# Patient Record
Sex: Female | Born: 1995 | Race: Black or African American | Hispanic: No | Marital: Single | Smoking: Never smoker
Health system: Southern US, Community
[De-identification: ages and names within clinical notes are randomized; demographics above are authoritative.]

## PROBLEM LIST (undated history)

## (undated) DIAGNOSIS — O139 Gestational [pregnancy-induced] hypertension without significant proteinuria, unspecified trimester: Secondary | ICD-10-CM

## (undated) HISTORY — DX: Gestational (pregnancy-induced) hypertension without significant proteinuria, unspecified trimester: O13.9

---

## 2017-03-11 ENCOUNTER — Other Ambulatory Visit: Payer: Self-pay | Admitting: Nurse Practitioner

## 2017-03-11 ENCOUNTER — Other Ambulatory Visit (HOSPITAL_COMMUNITY)
Admission: RE | Admit: 2017-03-11 | Discharge: 2017-03-11 | Disposition: A | Discharge status: Home / Self Care | Payer: BC Managed Care – PPO | Source: Ambulatory Visit | Attending: Obstetrics and Gynecology | Admitting: Obstetrics and Gynecology

## 2017-03-11 DIAGNOSIS — Z01419 Encounter for gynecological examination (general) (routine) without abnormal findings: Secondary | ICD-10-CM | POA: Insufficient documentation

## 2017-03-15 LAB — CYTOLOGY - PAP: Diagnosis: NEGATIVE

## 2020-02-28 ENCOUNTER — Ambulatory Visit: Payer: BC Managed Care – PPO | Admitting: Podiatry

## 2020-10-17 ENCOUNTER — Other Ambulatory Visit: Payer: Self-pay

## 2020-10-17 ENCOUNTER — Encounter (HOSPITAL_COMMUNITY): Payer: Self-pay | Admitting: Emergency Medicine

## 2020-10-17 ENCOUNTER — Emergency Department (HOSPITAL_COMMUNITY)
Admission: EM | Admit: 2020-10-17 | Discharge: 2020-10-17 | Disposition: A | Discharge status: Home / Self Care | Payer: BC Managed Care – PPO | Attending: Emergency Medicine | Admitting: Emergency Medicine

## 2020-10-17 DIAGNOSIS — R1030 Lower abdominal pain, unspecified: Secondary | ICD-10-CM

## 2020-10-17 LAB — CBC
HCT: 38.9 % (ref 36.0–46.0)
Hemoglobin: 12.6 g/dL (ref 12.0–15.0)
MCH: 30.4 pg (ref 26.0–34.0)
MCHC: 32.4 g/dL (ref 30.0–36.0)
MCV: 93.7 fL (ref 80.0–100.0)
Platelets: 257 10*3/uL (ref 150–400)
RBC: 4.15 MIL/uL (ref 3.87–5.11)
RDW: 13 % (ref 11.5–15.5)
WBC: 5.7 10*3/uL (ref 4.0–10.5)
nRBC: 0 % (ref 0.0–0.2)

## 2020-10-17 LAB — LIPASE, BLOOD: Lipase: 34 U/L (ref 11–51)

## 2020-10-17 LAB — COMPREHENSIVE METABOLIC PANEL
ALT: 20 U/L (ref 0–44)
AST: 19 U/L (ref 15–41)
Albumin: 4.1 g/dL (ref 3.5–5.0)
Alkaline Phosphatase: 44 U/L (ref 38–126)
Anion gap: 9 (ref 5–15)
BUN: 14 mg/dL (ref 6–20)
CO2: 24 mmol/L (ref 22–32)
Calcium: 9.1 mg/dL (ref 8.9–10.3)
Chloride: 107 mmol/L (ref 98–111)
Creatinine, Ser: 0.75 mg/dL (ref 0.44–1.00)
GFR, Estimated: >60 mL/min (ref >60)
Glucose, Bld: 96 mg/dL (ref 70–99)
Potassium: 3.9 mmol/L (ref 3.5–5.1)
Sodium: 140 mmol/L (ref 135–145)
Total Bilirubin: 0.2 mg/dL — ABNORMAL LOW (ref 0.3–1.2)
Total Protein: 7.2 g/dL (ref 6.5–8.1)

## 2020-10-17 LAB — URINALYSIS, ROUTINE W REFLEX MICROSCOPIC
Bilirubin Urine: NEGATIVE
Glucose, UA: NEGATIVE mg/dL
Hgb urine dipstick: NEGATIVE
Ketones, ur: NEGATIVE mg/dL
Leukocytes,Ua: NEGATIVE
Nitrite: NEGATIVE
Protein, ur: NEGATIVE mg/dL
Specific Gravity, Urine: 1.026 (ref 1.005–1.030)
pH: 6 (ref 5.0–8.0)

## 2020-10-17 LAB — I-STAT BETA HCG BLOOD, ED (MC, WL, AP ONLY): I-stat hCG, quantitative: <5 m[IU]/mL (ref <5)

## 2020-10-17 MED ORDER — SUCRALFATE 1 G PO TABS: 1.0000 g | ORAL_TABLET | Freq: Three times a day (TID) | ORAL | 0 refills | Status: DC

## 2020-10-17 MED ORDER — OMEPRAZOLE 20 MG PO CPDR: 20.0000 mg | DELAYED_RELEASE_CAPSULE | Freq: Every day | ORAL | 0 refills | Status: DC

## 2020-10-17 NOTE — ED Provider Notes (Signed)
[facility]-EMERGENCY DEPT Provider Note   CSN: [id number] Arrival date & time: 10/17/20  2032     History Chief Complaint  Patient presents with  . Abdominal Pain    JAKYRIA Brooks is a 25 y.o. female.  Patient presents to the ED with a chief complaint of abdominal pain.  She states that the pain is in her lower abdomen.  She states that her symptoms have been gradually worsening for the past few months.  She states that she feels bloated after eating and states that her pain is worsened with eating.  She denies pelvic pain, vaginal/urinary symptoms.  She denies any treatments PTA.  She denies any fever or vomiting.  The history is provided by the patient. No language interpreter was used.       History reviewed. No pertinent past medical history.  There are no problems to display for this patient.   History reviewed. No pertinent surgical history.   OB History   No obstetric history on file.     History reviewed. No pertinent family history.  Social History   Tobacco Use  . Smoking status: Never Smoker  . Smokeless tobacco: Never Used  Vaping Use  . Vaping Use: Never used  Substance Use Topics  . Alcohol use: Yes  . Drug use: Never    Home Medications Prior to Admission medications   Not on File    Allergies    Patient has no known allergies.  Review of Systems   Review of Systems  All other systems reviewed and are negative.   Physical Exam Updated Vital Signs BP (!) 147/76 (BP Location: Left Arm)   Pulse 85   Temp 98.7 F (37.1 C) (Oral)   Resp 18   Ht 5\' 9"  (1.753 m)   Wt 83.9 kg   LMP 10/08/2020   SpO2 95%   BMI 27.32 kg/m   Physical Exam Vitals and nursing note reviewed.  Constitutional:      General: She is not in acute distress.    Appearance: She is well-developed and well-nourished.  HENT:     Head: Normocephalic and atraumatic.  Eyes:     Conjunctiva/sclera: Conjunctivae normal.  Cardiovascular:      Rate and Rhythm: Normal rate and regular rhythm.     Heart sounds: No murmur heard.   Pulmonary:     Effort: Pulmonary effort is normal. No respiratory distress.     Breath sounds: Normal breath sounds.  Abdominal:     Palpations: Abdomen is soft.     Tenderness: There is no abdominal tenderness.     Comments: No focal abdominal tenderness, no RLQ tenderness or pain at McBurney's point, no RUQ tenderness or Murphy's sign, no left-sided abdominal tenderness, no fluid wave, or signs of peritonitis   Musculoskeletal:        General: No edema. Normal range of motion.     Cervical back: Neck supple.  Skin:    General: Skin is warm and dry.  Neurological:     Mental Status: She is alert and oriented to person, place, and time.  Psychiatric:        Mood and Affect: Mood and affect and mood normal.        Behavior: Behavior normal.     ED Results / Procedures / Treatments   Labs (all labs ordered are listed, but only abnormal results are displayed) Labs Reviewed  COMPREHENSIVE METABOLIC PANEL - Abnormal; Notable for the following components:  Result Value   Total Bilirubin 0.2 (*)    All other components within normal limits  LIPASE, BLOOD  CBC  URINALYSIS, ROUTINE W REFLEX MICROSCOPIC  I-STAT BETA HCG BLOOD, ED ([facility], [facility], [facility] ONLY)    EKG None  Radiology No results found.  Procedures Procedures   Medications Ordered in ED Medications - No data to display  ED Course  I have reviewed the triage vital signs and the nursing notes.  Pertinent labs & imaging results that were available during my care of the patient were reviewed by me and considered in my medical decision making (see chart for details).    MDM Rules/Calculators/A&P                          Patient presents with gradually worsening lower abdominal pain x3 months.  She has no focal tenderness on my exam.  Vital signs are stable.  Afebrile.  She denies dysuria, denies vaginal discharge or bleeding.  She  is scheduled to see GI at the end of this month.  Her symptoms are worsened with eating, and she states that she feels bloated.  Laboratory work-up is reassuring tonight.  No leukocytosis, no electrolyte derangement, normal LFTs, normal lipase, pregnancy test negative.  We discussed advanced imaging, but at this time, feel that it is likely not indicated and that she would be better off seeing GI.  I did offer to obtain CT, but she declined and will follow up.  Return precautions discussed.   Final Clinical Impression(s) / ED Diagnoses Final diagnoses:  Lower abdominal pain    Rx / DC Orders ED Discharge Orders    None       Roxy Horseman, PA-C 10/17/20 2315    Molpus, Jonny Ruiz, MD 10/18/20 [phone]

## 2020-10-17 NOTE — ED Triage Notes (Signed)
Patient is complaining of left lower abdominal pain that started in December. Patient states that it has gotten more painful in the last couple of weeks

## 2021-05-08 ENCOUNTER — Telehealth: Payer: Self-pay | Admitting: *Deleted

## 2021-05-08 NOTE — Telephone Encounter (Signed)
Not a New GYN but a New OB. LVM for patient to reschedule as a New OB with either [facility] or [facility] and that this appointment will be canceled. Has an appointment at both locations.

## 2021-05-21 ENCOUNTER — Encounter: Payer: BC Managed Care – PPO | Admitting: Family Medicine

## 2021-06-06 ENCOUNTER — Other Ambulatory Visit: Payer: Self-pay

## 2021-06-06 ENCOUNTER — Ambulatory Visit (INDEPENDENT_AMBULATORY_CARE_PROVIDER_SITE_OTHER): Payer: BC Managed Care – PPO

## 2021-06-06 DIAGNOSIS — Z3201 Encounter for pregnancy test, result positive: Secondary | ICD-10-CM

## 2021-06-06 LAB — POCT PREGNANCY, URINE: Preg Test, Ur: POSITIVE — AB

## 2021-06-06 NOTE — Progress Notes (Signed)
Pt dropped off urine today for UPT. UPT is  positive.  Pt left office before being seen by provider. Called pt once by front office with no answer. No message left.   Judeth Cornfield, RN

## 2021-06-09 ENCOUNTER — Encounter: Payer: BC Managed Care – PPO | Admitting: Obstetrics & Gynecology

## 2021-06-11 ENCOUNTER — Encounter: Payer: BC Managed Care – PPO | Admitting: Family Medicine

## 2021-06-11 ENCOUNTER — Telehealth (INDEPENDENT_AMBULATORY_CARE_PROVIDER_SITE_OTHER): Payer: BC Managed Care – PPO

## 2021-06-11 DIAGNOSIS — Z3A14 14 weeks gestation of pregnancy: Secondary | ICD-10-CM

## 2021-06-11 DIAGNOSIS — Z3480 Encounter for supervision of other normal pregnancy, unspecified trimester: Secondary | ICD-10-CM | POA: Insufficient documentation

## 2021-06-11 DIAGNOSIS — Z3492 Encounter for supervision of normal pregnancy, unspecified, second trimester: Secondary | ICD-10-CM

## 2021-06-11 NOTE — Patient Instructions (Signed)
  At our [facility], we work as an integrated team, providing care to address both physical and emotional health. Your medical provider may refer you to see our Behavioral Health Clinician (BHC) on the same day you see your medical provider, as availability permits; often scheduled virtually at your convenience.  Our BHC is available to all patients, visits generally last between 20-30 minutes, but can be longer or shorter, depending on patient need. The BHC offers help with stress management, coping with symptoms of depression and anxiety, major life changes , sleep issues, changing risky behavior, grief and loss, life stress, working on personal life goals, and  behavioral health issues, as these all affect your overall health and wellness.  The BHC is NOT available for the following: FMLA paperwork, court-ordered evaluations, specialty assessments (custody or disability), letters to employers, or obtaining certification for an emotional support animal. The BHC does not provide long-term therapy. You have the right to refuse integrated behavioral health services, or to reschedule to see the BHC at a later date.  Confidentiality exception: If it is suspected that a child or disabled adult is being abused or neglected, we are required by law to report that to either Child Protective Services or Adult Protective Services.  If you have a diagnosis of Bipolar affective disorder, Schizophrenia, or recurrent Major depressive disorder, we will recommend that you establish care with a psychiatrist, as these are lifelong, chronic conditions, and we want your overall emotional health and medications to be more closely monitored. If you anticipate needing extended maternity leave due to mental health issues postpartum, it it recommended you inform your medical provider, so we can put in a referral to a psychiatrist as soon as possible. The BHC is unable to recommend an extended maternity leave for mental  health issues. Your medical provider or BHC may refer you to a therapist for ongoing, traditional therapy, or to a psychiatrist, for medication management, if it would benefit your overall health. Depending on your insurance, you may have a copay or be charged a deductible, depending on your insurance, to see the BHC. If you are uninsured, it is recommended that you apply for financial assistance. (Forms may be requested at the front desk for in-person visits, via MyChart, or request a form during a virtual visit).  If you see the BHC more than 6 times, you will have to complete a comprehensive clinical assessment interview with the BHC to resume integrated services.  For virtual visits with the BHC, you must be physically in the state of [place] at the time of the visit. For example, if you live in [place], you will have to do an in-person visit with the BHC, and your out-of-state insurance may not cover behavioral health services in [place]. If you are going out of the state or country for any reason, the BHC may see you virtually when you return to [place], but not while you are physically outside of [place].    

## 2021-06-11 NOTE — Progress Notes (Addendum)
New OB Intake  I connected with  Zella Richer on 06/11/21 at  3:15 PM EDT by MyChart Video Visit and verified that I am speaking with the correct person using two identifiers. Nurse is located at [facility] and pt is located at home.  I discussed the limitations, risks, security and privacy concerns of performing an evaluation and management service by telephone and the availability of in person appointments. I also discussed with the patient that there may be a patient responsible charge related to this service. The patient expressed understanding and agreed to proceed.  I explained I am completing New OB Intake today. We discussed her EDD of 12/09/21 that is based on LMP of 03/04/21. Pt is G2/P1. I reviewed her allergies, medications, Medical/Surgical/OB history, and appropriate screenings. I informed her of [facility] services. [facility] information placed in AVS. Based on history, this is a low risk pregnancy.   Patient Active Problem List   Diagnosis Date Noted   Supervision of low-risk pregnancy, second trimester 06/11/2021    Concerns addressed today Prenatal care with first pregnancy was completed by Jennet Maduro, MD in [place], [place]; delivered at [facility] [place]. Patient reports elevated blood pressure and temperature following delivery. States she was not diagnosed with pre-eclampsia or gestational hypertension. Was not discharged on any blood pressure medication.  Vaginal discharge: patient reports increased vaginal discharge with no odor or discomfort. Reports this looks similar to mucous plug with her first pregnancy. I reassured pt that increased normal vaginal discharge is expected during pregnancy and to follow up if this begins to have an odor or if she experiences discomfort. Also, offered vaginal swab at new ob appt.   Delivery Plans:  Plans to deliver at [facility].   MyChart/Babyscripts MyChart access verified. I explained pt will have some visits in office and some  virtually. Babyscripts instructions given and order placed.   Blood Pressure Cuff  Patient has BP cuff at home. Explained after first prenatal appt pt will check weekly and document in Babyscripts.  Weight scale: Patient has access to weight scale, not at home. Weight scale ordered for patient to pick up form [facility].   Anatomy US Explained first scheduled Korea will be around 19 weeks. Anatomy US scheduled for 07/17/21 at 1430.  Labs Discussed Avelina Laine genetic screening with patient. Would like both Panorama and Horizon drawn at new OB visit. Routine prenatal labs needed.  COVID Vaccine Patient has had Pfizer COVID vaccine. Believes this was January and February 2022.  Mother/ Baby Dyad Candidate?    Not a candidate.  Social Determinants of Health Food Insecurity: Patient denies food insecurity. WIC Referral: Patient is not interested in referral to [facility].  Transportation: Patient denies transportation needs. Childcare: Discussed no children allowed at ultrasound appointments. Offered childcare services; patient declines childcare services at this time.  First visit review I reviewed new OB appt with pt. I explained she will have a provider visit that will include a physical exam and ob bloodwork with genetic screening. PAP smear completed in March of 2023, will have ROI signed at first appt. Explained pt will be seen by Luna Kitchens, CNM at first visit; encounter routed to appropriate provider. Explained that patient will be seen by pregnancy navigator following visit with provider.   Marjo Bicker, RN 06/11/2021  3:32 PM

## 2021-06-19 NOTE — Progress Notes (Signed)
Chart reviewed for nurse visit. Agree with plan of care.   Lavan Imes Lorraine, CNM 06/19/2021 5:23 PM   

## 2021-06-27 ENCOUNTER — Encounter: Payer: BC Managed Care – PPO | Admitting: Family

## 2021-06-30 ENCOUNTER — Other Ambulatory Visit (HOSPITAL_COMMUNITY)
Admission: RE | Admit: 2021-06-30 | Discharge: 2021-06-30 | Disposition: A | Discharge status: Home / Self Care | Payer: Medicaid Other | Source: Ambulatory Visit | Attending: Family | Admitting: Family

## 2021-06-30 ENCOUNTER — Other Ambulatory Visit: Payer: Self-pay

## 2021-06-30 ENCOUNTER — Ambulatory Visit (INDEPENDENT_AMBULATORY_CARE_PROVIDER_SITE_OTHER): Payer: BC Managed Care – PPO | Admitting: Student

## 2021-06-30 VITALS — BP 123/70 | HR 95 | Wt 188.9 lb

## 2021-06-30 DIAGNOSIS — Z23 Encounter for immunization: Secondary | ICD-10-CM | POA: Diagnosis not present

## 2021-06-30 DIAGNOSIS — Z3483 Encounter for supervision of other normal pregnancy, third trimester: Secondary | ICD-10-CM | POA: Diagnosis not present

## 2021-06-30 DIAGNOSIS — Z3A16 16 weeks gestation of pregnancy: Secondary | ICD-10-CM

## 2021-06-30 DIAGNOSIS — Z3492 Encounter for supervision of normal pregnancy, unspecified, second trimester: Secondary | ICD-10-CM | POA: Insufficient documentation

## 2021-06-30 DIAGNOSIS — Z3A40 40 weeks gestation of pregnancy: Secondary | ICD-10-CM | POA: Diagnosis not present

## 2021-06-30 DIAGNOSIS — O0931 Supervision of pregnancy with insufficient antenatal care, first trimester: Secondary | ICD-10-CM

## 2021-06-30 LAB — POCT URINALYSIS DIP (DEVICE)
Bilirubin Urine: NEGATIVE
Glucose, UA: NEGATIVE mg/dL
Hgb urine dipstick: NEGATIVE
Ketones, ur: NEGATIVE mg/dL
Leukocytes,Ua: NEGATIVE
Nitrite: NEGATIVE
Protein, ur: NEGATIVE mg/dL
Specific Gravity, Urine: >=1.03 (ref 1.005–1.030)
Urobilinogen, UA: 0.2 mg/dL (ref 0.0–1.0)
pH: 5.5 (ref 5.0–8.0)

## 2021-06-30 NOTE — Progress Notes (Signed)
  Subjective:    Carol Brooks is being seen today for her first obstetrical visit.  This is a planned pregnancy. She is at [redacted]w[redacted]d gestation. Her obstetrical history is significant for  nothing.  . Relationship with FOB: significant other, living together. Patient does intend to breast feed through exclusively pumping. Pregnancy history fully reviewed. No complications with last delivery.   Patient reports no complaints.  Review of Systems:   Review of Systems  Constitutional: Negative.   HENT: Negative.    Respiratory: Negative.    Cardiovascular: Negative.   Gastrointestinal: Negative.   Genitourinary: Negative.   Musculoskeletal: Negative.   Neurological: Negative.   Hematological: Negative.   Psychiatric/Behavioral: Negative.     Objective:     BP 123/70   Pulse 95   Wt 188 lb 14.4 oz (85.7 kg)   LMP 03/04/2021 (Exact Date)   BMI 27.90 kg/m  Physical Exam HENT:     Head: Normocephalic.  Cardiovascular:     Rate and Rhythm: Normal rate.  Pulmonary:     Effort: Pulmonary effort is normal.  Abdominal:     General: Abdomen is flat.  Musculoskeletal:        General: Normal range of motion.  Skin:    General: Skin is warm.  Neurological:     Mental Status: She is alert.    Exam    Assessment:    Pregnancy: G2P1001 Patient Active Problem List   Diagnosis Date Noted   Supervision of low-risk pregnancy, second trimester 06/11/2021       Plan:     Initial labs drawn. Prenatal vitamins. Problem list reviewed and updated. AFP3 discussed: ordered. Role of ultrasound in pregnancy discussed; fetal survey: ordered. Amniocentesis discussed: not indicated. Follow up in 4 weeks. 75% of 30 min visit spent counseling and coordination of care.  -welcomed patient to practice, explained role of students, residents, discussed that practice is large but that we will give her personalized care -Patient will sign release of information for pap smears -[place]  ordered -explained genetic screening and what results mean--difference between screening test and diagnostic test.  -all questions answered   [name] [facility] 06/30/2021

## 2021-07-01 LAB — GC/CHLAMYDIA PROBE AMP (~~LOC~~) NOT AT ARMC
Chlamydia: NEGATIVE
Comment: NEGATIVE
Comment: NORMAL
Neisseria Gonorrhea: NEGATIVE

## 2021-07-02 LAB — AFP, SERUM, OPEN SPINA BIFIDA
AFP MoM: 0.7
AFP Value: 23.1 ng/mL
Gest. Age on Collection Date: 16.6 weeks
Maternal Age At EDD: 25.8 yr
OSBR Risk 1 IN: 10000
Test Results:: NEGATIVE
Weight: 189 [lb_av]

## 2021-07-02 LAB — CULTURE, OB URINE

## 2021-07-02 LAB — CBC/D/PLT+RPR+RH+ABO+RUBIGG...
Antibody Screen: NEGATIVE
Basophils Absolute: 0.1 10*3/uL (ref 0.0–0.2)
Basos: 1 %
EOS (ABSOLUTE): 0.2 10*3/uL (ref 0.0–0.4)
Eos: 2 %
HCV Ab: <0.1 s/co ratio (ref 0.0–0.9)
HIV Screen 4th Generation wRfx: NONREACTIVE
Hematocrit: 34.3 % (ref 34.0–46.6)
Hemoglobin: 11.9 g/dL (ref 11.1–15.9)
Hepatitis B Surface Ag: NEGATIVE
Immature Grans (Abs): 0 10*3/uL (ref 0.0–0.1)
Immature Granulocytes: 0 %
Lymphocytes Absolute: 2.1 10*3/uL (ref 0.7–3.1)
Lymphs: 20 %
MCH: 30.7 pg (ref 26.6–33.0)
MCHC: 34.7 g/dL (ref 31.5–35.7)
MCV: 89 fL (ref 79–97)
Monocytes Absolute: 0.4 10*3/uL (ref 0.1–0.9)
Monocytes: 4 %
Neutrophils Absolute: 7.5 10*3/uL — ABNORMAL HIGH (ref 1.4–7.0)
Neutrophils: 73 %
Platelets: 258 10*3/uL (ref 150–450)
RBC: 3.87 x10E6/uL (ref 3.77–5.28)
RDW: 13 % (ref 11.7–15.4)
RPR Ser Ql: NONREACTIVE
Rh Factor: POSITIVE
Rubella Antibodies, IGG: 3.79 index (ref >0.99)
WBC: 10.3 10*3/uL (ref 3.4–10.8)

## 2021-07-02 LAB — HCV INTERPRETATION

## 2021-07-02 LAB — URINE CULTURE, OB REFLEX

## 2021-07-07 ENCOUNTER — Other Ambulatory Visit: Payer: Self-pay

## 2021-07-10 DIAGNOSIS — Z3480 Encounter for supervision of other normal pregnancy, unspecified trimester: Secondary | ICD-10-CM | POA: Diagnosis not present

## 2021-07-10 DIAGNOSIS — Z3A40 40 weeks gestation of pregnancy: Secondary | ICD-10-CM | POA: Diagnosis not present

## 2021-07-17 ENCOUNTER — Other Ambulatory Visit: Payer: Self-pay | Admitting: *Deleted

## 2021-07-17 ENCOUNTER — Ambulatory Visit: Payer: BC Managed Care – PPO | Attending: Student

## 2021-07-17 ENCOUNTER — Other Ambulatory Visit: Payer: Self-pay

## 2021-07-17 DIAGNOSIS — Z3492 Encounter for supervision of normal pregnancy, unspecified, second trimester: Secondary | ICD-10-CM | POA: Diagnosis not present

## 2021-07-17 DIAGNOSIS — Z362 Encounter for other antenatal screening follow-up: Secondary | ICD-10-CM

## 2021-07-19 ENCOUNTER — Encounter: Payer: Self-pay | Admitting: Student

## 2021-07-19 DIAGNOSIS — O35BXX0 Maternal care for other (suspected) fetal abnormality and damage, fetal cardiac anomalies, not applicable or unspecified: Secondary | ICD-10-CM | POA: Insufficient documentation

## 2021-07-28 ENCOUNTER — Ambulatory Visit (INDEPENDENT_AMBULATORY_CARE_PROVIDER_SITE_OTHER): Payer: Medicaid Other | Admitting: Student

## 2021-07-28 ENCOUNTER — Other Ambulatory Visit: Payer: Self-pay

## 2021-07-28 VITALS — BP 108/72 | HR 91 | Wt 198.5 lb

## 2021-07-28 DIAGNOSIS — Z3492 Encounter for supervision of normal pregnancy, unspecified, second trimester: Secondary | ICD-10-CM

## 2021-07-28 DIAGNOSIS — Z3A20 20 weeks gestation of pregnancy: Secondary | ICD-10-CM

## 2021-07-28 NOTE — Progress Notes (Signed)
No questions or concerns    Germaine Ripp, CMA  07/28/21

## 2021-07-28 NOTE — Progress Notes (Signed)
Patient ID: SUANNE MINAHAN, female   DOB: 10/25/1995, 25 y.o.   MRN: [id number]   PRENATAL VISIT NOTE  Subjective:  BROOKLEN RUNQUIST is a 25 y.o. G2P1001 at [redacted]w[redacted]d being seen today for ongoing prenatal care.  She is currently monitored for the following issues for this low-risk pregnancy and has Supervision of low-risk pregnancy, second trimester and Fetal cardiac anomaly affecting pregnancy, antepartum on their problem list.  Patient reports no complaints.  Contractions: Not present. Vag. Bleeding: None.  Movement: Present. Denies leaking of fluid.   The following portions of the patient's history were reviewed and updated as appropriate: allergies, current medications, past family history, past medical history, past social history, past surgical history and problem list.   Objective:   Vitals:   07/28/21 1612  BP: 108/72  Pulse: 91  Weight: 198 lb 8 oz (90 kg)    Fetal Status: Fetal Heart Rate (bpm): 155 Fundal Height: 20 cm Movement: Present     General:  Alert, oriented and cooperative. Patient is in no acute distress.  Skin: Skin is warm and dry. No rash noted.   Cardiovascular: Normal heart rate noted  Respiratory: Normal respiratory effort, no problems with respiration noted  Abdomen: Soft, gravid, appropriate for gestational age.  Pain/Pressure: Absent     Pelvic: Cervical exam deferred        Extremities: Normal range of motion.  Edema: None  Mental Status: Normal mood and affect. Normal behavior. Normal judgment and thought content.   Assessment and Plan:  Pregnancy: G2P1001 at [redacted]w[redacted]d 1. Supervision of low-risk pregnancy, second trimester -doing well; she thinks husband will get vasectomy -follow up in December -ok to do MyChart next visit  Preterm labor symptoms and general obstetric precautions including but not limited to vaginal bleeding, contractions, leaking of fluid and fetal movement were reviewed in detail with the patient. Please refer to After Visit Summary  for other counseling recommendations.   Return in about 4 weeks (around 08/25/2021), or LROb on MyChart with KK.  Future Appointments  Date Time Provider Department Center  08/14/2021  2:45 PM [facility] NURSE [facility]  08/14/2021  3:00 PM [facility] US1 [facility]  08/26/2021 10:55 AM Crisoforo Oxford, [name], CNM [facility]    Marylene Land, CNM

## 2021-08-14 ENCOUNTER — Ambulatory Visit: Payer: BC Managed Care – PPO | Admitting: *Deleted

## 2021-08-14 ENCOUNTER — Ambulatory Visit: Payer: BC Managed Care – PPO | Attending: Maternal & Fetal Medicine

## 2021-08-14 ENCOUNTER — Encounter: Payer: Self-pay | Admitting: *Deleted

## 2021-08-14 ENCOUNTER — Other Ambulatory Visit: Payer: Self-pay

## 2021-08-14 VITALS — BP 126/72 | HR 80

## 2021-08-14 DIAGNOSIS — Q210 Ventricular septal defect: Secondary | ICD-10-CM

## 2021-08-14 DIAGNOSIS — Z3A23 23 weeks gestation of pregnancy: Secondary | ICD-10-CM | POA: Diagnosis not present

## 2021-08-14 DIAGNOSIS — Z362 Encounter for other antenatal screening follow-up: Secondary | ICD-10-CM

## 2021-08-14 DIAGNOSIS — O358XX0 Maternal care for other (suspected) fetal abnormality and damage, not applicable or unspecified: Secondary | ICD-10-CM | POA: Diagnosis not present

## 2021-08-26 ENCOUNTER — Telehealth: Payer: Medicaid Other | Admitting: Student

## 2021-08-26 ENCOUNTER — Telehealth (INDEPENDENT_AMBULATORY_CARE_PROVIDER_SITE_OTHER): Payer: Medicaid Other | Admitting: Student

## 2021-08-26 DIAGNOSIS — Z3A25 25 weeks gestation of pregnancy: Secondary | ICD-10-CM

## 2021-08-26 DIAGNOSIS — Z3492 Encounter for supervision of normal pregnancy, unspecified, second trimester: Secondary | ICD-10-CM

## 2021-08-26 NOTE — Progress Notes (Signed)
I connected with Carol Brooks 08/26/21 at 10:35 AM EST by: MyChart video and verified that I am speaking with the correct person using two identifiers.  Patient is located at work and provider is located at clinic     The purpose of this virtual visit is to provide medical care while limiting exposure to the novel coronavirus. I discussed the limitations, risks, security and privacy concerns of performing an evaluation and management service by MyChart video and the availability of in person appointments. I also discussed with the patient that there may be a patient responsible charge related to this service. By engaging in this virtual visit, you consent to the provision of healthcare.  Additionally, you authorize for your insurance to be billed for the services provided during this visit.  The patient expressed understanding and agreed to proceed.  The following staff members participated in the virtual visit:  Zwaye    PRENATAL VISIT NOTE  Subjective:  Carol Brooks is a 25 y.o. G2P1001 at [redacted]w[redacted]d  for phone visit for ongoing prenatal care.  She is currently monitored for the following issues for this low-risk pregnancy and has Supervision of low-risk pregnancy, second trimester and Fetal cardiac anomaly affecting pregnancy, antepartum on their problem list.  Patient reports no complaints.  Patinet has been taking her BP at home and it has been normal. She does not have cuff with her today. She is feeling good!  Contractions: Not present. Vag. Bleeding: None.  Movement: Present. Denies leaking of fluid.   The following portions of the patient's history were reviewed and updated as appropriate: allergies, current medications, past family history, past medical history, past social history, past surgical history and problem list.   Objective:  There were no vitals filed for this visit. Self-Obtained  Fetal Status:     Movement: Present     Assessment and Plan:  Pregnancy: G2P1001 at  [redacted]w[redacted]d 1. Supervision of low-risk pregnancy, second trimester Possible VSD is now resolved  Discussed GTT next visit   Preterm labor symptoms and general obstetric precautions including but not limited to vaginal bleeding, contractions, leaking of fluid and fetal movement were reviewed in detail with the patient.  Return in about 3 weeks (around 09/16/2021), or LROB and 2 hour GTT with KK.  No future appointments.   Time spent on virtual visit: 30 minutes  Marylene Land, CNM

## 2021-08-26 NOTE — Progress Notes (Signed)
I connected with  Zella Richer on 08/26/21 at 10:35 AM EST by MyChart Virtual Video Visit and verified that I am speaking with the correct person using two identifiers.   I discussed the limitations, risks, security and privacy concerns of performing an evaluation and management service by telephone and the availability of in person appointments. I also discussed with the patient that there may be a patient responsible charge related to this service. The patient expressed understanding and agreed to proceed.  Guy Begin, CMA 08/26/2021  10:30 AM

## 2021-09-14 NOTE — L&D Delivery Note (Signed)
OB/GYN Faculty Practice Delivery Note ? ?Carol Brooks is a 26 y.o. [id number] s/p SVD at home at [redacted]w[redacted]d. She was admitted for delivery of placenta and routine postpartum care.  ? ?ROM: 0h 18m with meconium-stained fluid per patient report ?GBS Status: NEgative ? ? ?Labor Progress: ?Patient had started to have contractions at home starting around 0100 and then felt the need to push at home and delivered on all fours by herself. Notes ROM with delivery was meconium stained per patient report. Then clamped umbilical cord and presented to MAU. She states delivery was 0830 AM. ? ?Delivery Date/Time: 12/15/21 at 0830 AM per patient report ?Delivery: Called to room and patient had delivered at home, cord was clamped with umbilical clamp and protruding from vaginal introitus, with minimal bleeding, and placenta noted to be right at the introitus. Pitocin started IV. Placenta delivered spontaneously with gentle cord traction at 1007, with large clot after placenta delivery. Fundus firm with massage and Pitocin. Labia, perineum, vagina, and cervix were inspected, first degree hemostatic tear noted thus not repaired.  ? ?Placenta: complete, three vessel cord appreciated ?Complications: None ?Lacerations: first degree perineal, hemostatic ?EBL: 150 mL ?Analgesia: none ? ?Postpartum Planning ?[x]  message to sent to schedule follow-up  ? ?Infant: viable female  APGARs unknown due to home delivery  2800 g ? ? , MD ?Center for [facility], [facility]  ? ? ? ? ? ?

## 2021-09-17 ENCOUNTER — Other Ambulatory Visit: Payer: Self-pay

## 2021-09-17 DIAGNOSIS — Z3492 Encounter for supervision of normal pregnancy, unspecified, second trimester: Secondary | ICD-10-CM

## 2021-09-19 ENCOUNTER — Ambulatory Visit (INDEPENDENT_AMBULATORY_CARE_PROVIDER_SITE_OTHER): Payer: BC Managed Care – PPO | Admitting: Family

## 2021-09-19 ENCOUNTER — Other Ambulatory Visit: Payer: Self-pay

## 2021-09-19 ENCOUNTER — Other Ambulatory Visit: Payer: BC Managed Care – PPO

## 2021-09-19 DIAGNOSIS — Z23 Encounter for immunization: Secondary | ICD-10-CM | POA: Diagnosis not present

## 2021-09-19 DIAGNOSIS — Z3493 Encounter for supervision of normal pregnancy, unspecified, third trimester: Secondary | ICD-10-CM

## 2021-09-19 DIAGNOSIS — Z3A28 28 weeks gestation of pregnancy: Secondary | ICD-10-CM

## 2021-09-19 DIAGNOSIS — Z3492 Encounter for supervision of normal pregnancy, unspecified, second trimester: Secondary | ICD-10-CM | POA: Diagnosis not present

## 2021-09-19 NOTE — Progress Notes (Signed)
   PRENATAL VISIT NOTE  Subjective:  Carol Brooks is a 26 y.o. G2P1001 at [redacted]w[redacted]d being seen today for ongoing prenatal care.  She is currently monitored for the following issues for this low-risk pregnancy and has Supervision of low-risk pregnancy, second trimester and Fetal cardiac anomaly affecting pregnancy, antepartum on their problem list.  Patient reports no complaints.  Contractions: Not present. Vag. Bleeding: None.  Movement: Present. Denies leaking of fluid.   The following portions of the patient's history were reviewed and updated as appropriate: allergies, current medications, past family history, past medical history, past social history, past surgical history and problem list.   Objective:   Vitals:   09/19/21 0825  BP: 119/79  Pulse: (!) 112  Weight: 214 lb 11.2 oz (97.4 kg)    Fetal Status: Fetal Heart Rate (bpm): 155   Movement: Present     General:  Alert, oriented and cooperative. Patient is in no acute distress.  Skin: Skin is warm and dry. No rash noted.   Cardiovascular: Normal heart rate noted  Respiratory: Normal respiratory effort, no problems with respiration noted  Abdomen: Soft, gravid, appropriate for gestational age.  Pain/Pressure: Absent     Pelvic: Cervical exam deferred        Extremities: Normal range of motion.  Edema: None  Mental Status: Normal mood and affect. Normal behavior. Normal judgment and thought content.   Assessment and Plan:  Pregnancy: G2P1001 at [redacted]w[redacted]d 1. Supervision of low-risk pregnancy, third trimester - Reviewed labs for today (Third Trimester) - Discussed doula services and role of doula for someone desiring non-medicated birth - Given website info on doulas and list of private doulas Preterm labor symptoms and general obstetric precautions including but not limited to vaginal bleeding, contractions, leaking of fluid and fetal movement were reviewed in detail with the patient. Please refer to After Visit Summary for  other counseling recommendations.   Return in about 2 weeks (around 10/03/2021).  No future appointments.  Eino Farber [name], CNM

## 2021-09-19 NOTE — Patient Instructions (Signed)
DOULA LIST   [facility] Doula  [place]  [phone]  [email or web address]  [email or web address]  Zula the [employer] [phone]  [email or web address]   [employer]   Precious Danford Bad   [email or web address]   ??THE MOTHERLY DOULA?? Zola Button   [phone]   [email or web address]     [facility] Doula  Olive Bass  [phone]    [email or web address]   [employer] [facility]  Angie Rosier     [phone]     [email or web address]   Renato Gails: Doula & Photographer   Renato Gails [phone]       [email or web address]  [email or web address]   [employer] [facility]oula [facility]  [place] [phone]   [email or web address] [place], [place]  Lolita Rieger  [phone]  [email or web address]   [email or web address]   [facility]   Kizzie Furnish   [phone]  [email or web address]   Dina Rich [place] Doula  Dina Rich  [phone] [email or web address]  [facility] Doulas  Cornelious Bryant         [facility]       Lora Reynolds     [phone] [email or web address]  [email or web address]   [facility]   Glendon Foxx [phone] [email or web address]   [facility] Doula Services  Camelia Eng     [phone]  [email or web address]  Soleil Doula  [place]     [phone]  [email or web address]  Facebook and IG [email or web address] .  [phone] [phone] [email or web address]   [phone]  [phone] [email or web address]     [facility]  [phone] [email or web address]   IG [email or web address]    [phone]    [phone]  [email or web address]   Lurline Hare "Tuttle" Free  [phone] [email or web address]    Mtende Roll  [phone] [email or web address]   Susie Williams   [email or web address]    [phone]    [phone] [email or web address]     Denita Lung  [phone] [email or web address] .[email or web address] Galloway  [phone] [email or web address]    [place] Rhem    [phone]   [facility] Red wing  [phone] [email or web address]  Doula Mama [phone] [email or web address] [email or web address]  [facility] Maryjean Ka  [phone] [email or web address]  [facility] [phone]  [email or web address]  [email or web address] Montgomery Harris-Jones  [phone] [email or web address]   Allen Kell [phone] [email or web address] .Ardine Eng   [phone] [email or web address]   Gilda Crease  [phone] [email or web address]  [employer]  Jonelle Sports  [phone] [email or web address]   Jackelyn Poling [phone] [email or web address]  MOOR Divine Myeka Dunn  [email or web address] .[email or web address]   Cristina Gong [phone] [email or web address]   Glory Buff [phone] [email or web address]   Whitney Muse [phone] [email or web address]     Doula Website Eldridge Abrahams

## 2021-09-20 LAB — GLUCOSE TOLERANCE, 2 HOURS W/ 1HR
Glucose, 1 hour: 103 mg/dL (ref 70–179)
Glucose, 2 hour: 97 mg/dL (ref 70–152)
Glucose, Fasting: 87 mg/dL (ref 70–91)

## 2021-09-20 LAB — CBC
Hematocrit: 34.2 % (ref 34.0–46.6)
Hemoglobin: 11.9 g/dL (ref 11.1–15.9)
MCH: 31.1 pg (ref 26.6–33.0)
MCHC: 34.8 g/dL (ref 31.5–35.7)
MCV: 89 fL (ref 79–97)
Platelets: 235 10*3/uL (ref 150–450)
RBC: 3.83 x10E6/uL (ref 3.77–5.28)
RDW: 12.1 % (ref 11.7–15.4)
WBC: 9.7 10*3/uL (ref 3.4–10.8)

## 2021-09-20 LAB — RPR: RPR Ser Ql: NONREACTIVE

## 2021-09-20 LAB — HIV ANTIBODY (ROUTINE TESTING W REFLEX): HIV Screen 4th Generation wRfx: NONREACTIVE

## 2021-09-20 LAB — ANTIBODY SCREEN: Antibody Screen: NEGATIVE

## 2021-10-02 NOTE — Progress Notes (Signed)
° °  PRENATAL VISIT NOTE  Subjective:  Carol Brooks is a 26 y.o. G2P1001 at [redacted]w[redacted]d being seen today for ongoing prenatal care.  She is currently monitored for the following issues for this low-risk pregnancy and has Supervision of low-risk pregnancy, second trimester and Fetal cardiac anomaly affecting pregnancy, antepartum on their problem list.  Patient reports no complaints.  Contractions: Not present. Vag. Bleeding: None.  Movement: Present. Denies leaking of fluid.   The following portions of the patient's history were reviewed and updated as appropriate: allergies, current medications, past family history, past medical history, past social history, past surgical history and problem list.   Objective:   Vitals:   10/03/21 0903  BP: 114/76  Pulse: 90  Weight: 221 lb (100.2 kg)    Fetal Status: Fetal Heart Rate (bpm): 165   Movement: Present     General:  Alert, oriented and cooperative. Patient is in no acute distress.  Skin: Skin is warm and dry. No rash noted.   Cardiovascular: Normal heart rate noted  Respiratory: Normal respiratory effort, no problems with respiration noted  Abdomen: Soft, gravid, appropriate for gestational age.  Pain/Pressure: Absent     Pelvic: Cervical exam deferred        Extremities: Normal range of motion.  Edema: None  Mental Status: Normal mood and affect. Normal behavior. Normal judgment and thought content.   Assessment and Plan:  Pregnancy: G2P1001 at [redacted]w[redacted]d 1. Supervision of low-risk pregnancy, second trimester 2 hr gtt and labs wnl S/p tdap  2. 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 Growth was at 23w and normal, 23%ile  Preterm labor symptoms and general obstetric precautions including but not limited to vaginal bleeding, contractions, leaking of fluid and fetal movement were reviewed in detail with the patient. Please refer to After Visit Summary  for other counseling recommendations.   Return in about 2 weeks (around 10/17/2021) for OB VISIT, MD or APP.  No future appointments.   Milas Hock, MD

## 2021-10-03 ENCOUNTER — Encounter: Payer: BC Managed Care – PPO | Admitting: Obstetrics and Gynecology

## 2021-10-03 ENCOUNTER — Ambulatory Visit (INDEPENDENT_AMBULATORY_CARE_PROVIDER_SITE_OTHER): Payer: BC Managed Care – PPO | Admitting: Obstetrics and Gynecology

## 2021-10-03 ENCOUNTER — Encounter: Payer: Self-pay | Admitting: Obstetrics and Gynecology

## 2021-10-03 ENCOUNTER — Other Ambulatory Visit: Payer: Self-pay

## 2021-10-03 VITALS — BP 114/76 | HR 90 | Wt 221.0 lb

## 2021-10-03 DIAGNOSIS — Z3492 Encounter for supervision of normal pregnancy, unspecified, second trimester: Secondary | ICD-10-CM

## 2021-10-03 DIAGNOSIS — O35BXX0 Maternal care for other (suspected) fetal abnormality and damage, fetal cardiac anomalies, not applicable or unspecified: Secondary | ICD-10-CM

## 2021-10-20 ENCOUNTER — Telehealth (INDEPENDENT_AMBULATORY_CARE_PROVIDER_SITE_OTHER): Payer: BC Managed Care – PPO | Admitting: Obstetrics & Gynecology

## 2021-10-20 VITALS — BP 103/59 | HR 98

## 2021-10-20 DIAGNOSIS — Z3493 Encounter for supervision of normal pregnancy, unspecified, third trimester: Secondary | ICD-10-CM

## 2021-10-20 DIAGNOSIS — Z3492 Encounter for supervision of normal pregnancy, unspecified, second trimester: Secondary | ICD-10-CM

## 2021-10-20 NOTE — Progress Notes (Signed)
° ° °  TELEHEALTH OBSTETRICS VISIT ENCOUNTER NOTE  Provider location: Center for [facility]   Patient location: Home  I connected with Carol Brooks on 10/20/21 at  3:35 PM EST by telephone at home and verified that I am speaking with the correct person using two identifiers. Of note, unable to do video encounter due to technical difficulties.    I discussed the limitations, risks, security and privacy concerns of performing an evaluation and management service by telephone and the availability of in person appointments. I also discussed with the patient that there may be a patient responsible charge related to this service. The patient expressed understanding and agreed to proceed.  Subjective:  Carol Brooks is a 26 y.o. G2P1001 at [redacted]w[redacted]d being followed for ongoing prenatal care.  She is currently monitored for the following issues for this low-risk pregnancy and has Supervision of low-risk pregnancy, second trimester on their problem list.  Patient reports no complaints. Reports fetal movement. Denies any contractions, bleeding or leaking of fluid.   The following portions of the patient's history were reviewed and updated as appropriate: allergies, current medications, past family history, past medical history, past social history, past surgical history and problem list.   Objective:  Blood pressure (!) 103/59, pulse 98, last menstrual period 03/04/2021. General:  Alert, oriented and cooperative.   Mental Status: Normal mood and affect perceived. Normal judgment and thought content.  Rest of physical exam deferred due to type of encounter  Assessment and Plan:  Pregnancy: G2P1001 at [redacted]w[redacted]d 1. Supervision of low-risk pregnancy, second trimester Doing well normal BP  Preterm labor symptoms and general obstetric precautions including but not limited to vaginal bleeding, contractions, leaking of fluid and fetal movement were reviewed in detail with the  patient.  I discussed the assessment and treatment plan with the patient. The patient was provided an opportunity to ask questions and all were answered. The patient agreed with the plan and demonstrated an understanding of the instructions. The patient was advised to call back or seek an in-person office evaluation/go to [facility] at [facility] for any urgent or concerning symptoms. Please refer to After Visit Summary for other counseling recommendations.   I provided 10 minutes of non-face-to-face time during this encounter.  Return in about 18 days (around 11/07/2021).  Future Appointments  Date Time Provider Department Center  11/07/2021 10:55 AM Magnus Sinning, Sherryll Burger [facility]    Scheryl Darter, MD Center for [facility], [facility]

## 2021-10-20 NOTE — Progress Notes (Signed)
I connected with  Carol Brooks on 10/20/21 at  3:35 PM EST by MyChart Virtual Video Visit and verified that I am speaking with the correct person using two identifiers.   I discussed the limitations, risks, security and privacy concerns of performing an evaluation and management service by telephone and the availability of in person appointments. I also discussed with the patient that there may be a patient responsible charge related to this service. The patient expressed understanding and agreed to proceed.  Mena Goes, CMA 10/20/2021  3:46 PM

## 2021-10-30 ENCOUNTER — Telehealth: Payer: Self-pay | Admitting: Family Medicine

## 2021-10-30 DIAGNOSIS — R12 Heartburn: Secondary | ICD-10-CM

## 2021-10-30 NOTE — Telephone Encounter (Signed)
Patient need to speak to someone regarding her Acid Reflex

## 2021-10-31 MED ORDER — FAMOTIDINE 20 MG PO TABS: 20.0000 mg | ORAL_TABLET | Freq: Two times a day (BID) | ORAL | 0 refills | Status: DC

## 2021-10-31 NOTE — Telephone Encounter (Signed)
Called pt back. Pt reports heartburn is worsening. Not only experiencing after meals. Offered Pepcid, pt agreeable. Sent to preferred pharmacy. Recommended Tums for breakthrough.

## 2021-11-03 ENCOUNTER — Other Ambulatory Visit: Payer: Self-pay | Admitting: Lactation Services

## 2021-11-03 MED ORDER — FAMOTIDINE 20 MG PO TABS: 20.0000 mg | ORAL_TABLET | Freq: Two times a day (BID) | ORAL | 0 refills | Status: DC

## 2021-11-03 NOTE — Progress Notes (Signed)
Ordered 90 day supply at patient request.

## 2021-11-07 ENCOUNTER — Encounter: Payer: BC Managed Care – PPO | Admitting: Medical

## 2021-11-07 ENCOUNTER — Other Ambulatory Visit: Payer: Self-pay

## 2021-11-07 ENCOUNTER — Encounter: Payer: Self-pay | Admitting: Obstetrics and Gynecology

## 2021-11-07 ENCOUNTER — Ambulatory Visit (INDEPENDENT_AMBULATORY_CARE_PROVIDER_SITE_OTHER): Payer: BC Managed Care – PPO | Admitting: Obstetrics and Gynecology

## 2021-11-07 VITALS — BP 117/82 | HR 95 | Wt 229.0 lb

## 2021-11-07 DIAGNOSIS — Z3492 Encounter for supervision of normal pregnancy, unspecified, second trimester: Secondary | ICD-10-CM

## 2021-11-07 NOTE — Progress Notes (Signed)
° °  PRENATAL VISIT NOTE  Subjective:  Carol Brooks is a 26 y.o. G2P1001 at [redacted]w[redacted]d being seen today for ongoing prenatal care.  She is currently monitored for the following issues for this low-risk pregnancy and has Supervision of low-risk pregnancy, second trimester on their problem list.  Patient reports no complaints.  Contractions: Not present. Vag. Bleeding: None.  Movement: Present. Denies leaking of fluid.   The following portions of the patient's history were reviewed and updated as appropriate: allergies, current medications, past family history, past medical history, past social history, past surgical history and problem list.   Objective:   Vitals:   11/07/21 1114  BP: 117/82  Pulse: 95  Weight: 229 lb (103.9 kg)    Fetal Status: Fetal Heart Rate (bpm): 161 Fundal Height: 36 cm Movement: Present     General:  Alert, oriented and cooperative. Patient is in no acute distress.  Skin: Skin is warm and dry. No rash noted.   Cardiovascular: Normal heart rate noted  Respiratory: Normal respiratory effort, no problems with respiration noted  Abdomen: Soft, gravid, appropriate for gestational age.  Pain/Pressure: Absent     Pelvic: Cervical exam deferred        Extremities: Normal range of motion.     Mental Status: Normal mood and affect. Normal behavior. Normal judgment and thought content.   Assessment and Plan:  Pregnancy: G2P1001 at [redacted]w[redacted]d 1. Supervision of low-risk pregnancy, second trimester Patient is doing well without complaints Cultures and GBS next visit Preterm labor precautions reviewed  Preterm labor symptoms and general obstetric precautions including but not limited to vaginal bleeding, contractions, leaking of fluid and fetal movement were reviewed in detail with the patient. Please refer to After Visit Summary for other counseling recommendations.   Return in about 1 week (around 11/14/2021) for in person, ROB, Low risk.  No future appointments.  Catalina Antigua, MD

## 2021-11-14 ENCOUNTER — Other Ambulatory Visit (HOSPITAL_COMMUNITY)
Admission: RE | Admit: 2021-11-14 | Discharge: 2021-11-14 | Disposition: A | Discharge status: Home / Self Care | Payer: BC Managed Care – PPO | Source: Ambulatory Visit | Attending: Family | Admitting: Family

## 2021-11-14 ENCOUNTER — Ambulatory Visit (INDEPENDENT_AMBULATORY_CARE_PROVIDER_SITE_OTHER): Payer: BC Managed Care – PPO | Admitting: Family

## 2021-11-14 ENCOUNTER — Other Ambulatory Visit: Payer: Self-pay

## 2021-11-14 VITALS — BP 116/81 | HR 115 | Wt 232.0 lb

## 2021-11-14 DIAGNOSIS — Z3492 Encounter for supervision of normal pregnancy, unspecified, second trimester: Secondary | ICD-10-CM

## 2021-11-14 DIAGNOSIS — Z3A36 36 weeks gestation of pregnancy: Secondary | ICD-10-CM

## 2021-11-14 NOTE — Progress Notes (Signed)
? ?  PRENATAL VISIT NOTE ? ?Subjective:  ?Carol Brooks is a 26 y.o. G2P1001 at [redacted]w[redacted]d being seen today for ongoing prenatal care.  She is currently monitored for the following issues for this low-risk pregnancy and has Supervision of low-risk pregnancy, second trimester and [redacted] weeks gestation of pregnancy on their problem list. ? ?Patient reports no complaints.  Contractions: Irritability. Vag. Bleeding: None.  Movement: Present. Denies leaking of fluid.  ? ?The following portions of the patient's history were reviewed and updated as appropriate: allergies, current medications, past family history, past medical history, past social history, past surgical history and problem list.  ? ?Objective:  ? ?Vitals:  ? 11/14/21 0929  ?BP: 116/81  ?Pulse: (!) 115  ?Weight: 232 lb (105.2 kg)  ? ? ?Fetal Status: Fetal Heart Rate (bpm): 158 Fundal Height: 36 cm Movement: Present  Presentation: Vertex ? ?General:  Alert, oriented and cooperative. Patient is in no acute distress.  ?Skin: Skin is warm and dry. No rash noted.   ?Cardiovascular: Normal heart rate noted  ?Respiratory: Normal respiratory effort, no problems with respiration noted  ?Abdomen: Soft, gravid, appropriate for gestational age.  Pain/Pressure: Absent     ?Pelvic: Cervical exam performed in the presence of a chaperone      ft/50  ?Extremities: Normal range of motion.  Edema: None  ?Mental Status: Normal mood and affect. Normal behavior. Normal judgment and thought content.  ? ?Assessment and Plan:  ?Pregnancy: G2P1001 at [redacted]w[redacted]d ?1. Supervision of low-risk pregnancy, second trimester ?- Culture, beta strep (group b only) ?- GC/Chlamydia probe amp ([facility])not at [facility] ? ?2. [redacted] weeks gestation of pregnancy ?- Reviewed testing for today ?- Obtained doula and it is going well ? ?Preterm labor symptoms and general obstetric precautions including but not limited to vaginal bleeding, contractions, leaking of fluid and fetal movement were reviewed in detail with the  patient. ?Please refer to After Visit Summary for other counseling recommendations.  ? ?Return in about 1 week (around 11/21/2021). ? ?Future Appointments  ?Date Time Provider Department Center  ?11/24/2021  8:55 AM Adam Phenix, MD [facility]  ? ? ?Eino Farber [name], CNM ? ?

## 2021-11-17 LAB — GC/CHLAMYDIA PROBE AMP (~~LOC~~) NOT AT ARMC
Chlamydia: NEGATIVE
Comment: NEGATIVE
Comment: NORMAL
Neisseria Gonorrhea: NEGATIVE

## 2021-11-18 LAB — CULTURE, BETA STREP (GROUP B ONLY): Strep Gp B Culture: NEGATIVE

## 2021-11-24 ENCOUNTER — Other Ambulatory Visit: Payer: Self-pay

## 2021-11-24 ENCOUNTER — Encounter: Payer: Self-pay | Admitting: Obstetrics & Gynecology

## 2021-11-24 ENCOUNTER — Ambulatory Visit (INDEPENDENT_AMBULATORY_CARE_PROVIDER_SITE_OTHER): Payer: BC Managed Care – PPO | Admitting: Obstetrics & Gynecology

## 2021-11-24 DIAGNOSIS — Z3492 Encounter for supervision of normal pregnancy, unspecified, second trimester: Secondary | ICD-10-CM

## 2021-11-24 DIAGNOSIS — Z3A37 37 weeks gestation of pregnancy: Secondary | ICD-10-CM

## 2021-11-24 NOTE — Progress Notes (Signed)
? ?  PRENATAL VISIT NOTE ? ?Subjective:  ?Carol Brooks is a 26 y.o. G2P1001 at [redacted]w[redacted]d being seen today for ongoing prenatal care.  She is currently monitored for the following issues for this low-risk pregnancy and has Supervision of low-risk pregnancy, second trimester on their problem list. ? ?Patient reports no complaints.  Contractions: Irritability. Vag. Bleeding: None.  Movement: Present. Denies leaking of fluid.  ? ?The following portions of the patient's history were reviewed and updated as appropriate: allergies, current medications, past family history, past medical history, past social history, past surgical history and problem list.  ? ?Objective:  ? ?Vitals:  ? 11/24/21 0903  ?BP: 123/83  ?Pulse: 87  ?Weight: 233 lb 3.2 oz (105.8 kg)  ? ? ?Fetal Status: Fetal Heart Rate (bpm): 138 Fundal Height: 37 cm Movement: Present    ? ?General:  Alert, oriented and cooperative. Patient is in no acute distress.  ?Skin: Skin is warm and dry. No rash noted.   ?Cardiovascular: Normal heart rate noted  ?Respiratory: Normal respiratory effort, no problems with respiration noted  ?Abdomen: Soft, gravid, appropriate for gestational age.  Pain/Pressure: Present     ?Pelvic: Cervical exam deferred        ?Extremities: Normal range of motion.  Edema: None  ?Mental Status: Normal mood and affect. Normal behavior. Normal judgment and thought content.  ? ?Assessment and Plan:  ?Pregnancy: G2P1001 at [redacted]w[redacted]d ?1. Supervision of low-risk pregnancy, second trimester ?Doing well  ? ?Term labor symptoms and general obstetric precautions including but not limited to vaginal bleeding, contractions, leaking of fluid and fetal movement were reviewed in detail with the patient. ?Please refer to After Visit Summary for other counseling recommendations.  ? ?Return in about 1 week (around 12/01/2021). ? ?No future appointments. ? ?Scheryl Darter, MD ? ?

## 2021-12-01 ENCOUNTER — Encounter: Payer: Self-pay | Admitting: Family Medicine

## 2021-12-01 ENCOUNTER — Other Ambulatory Visit: Payer: Self-pay

## 2021-12-01 ENCOUNTER — Ambulatory Visit (INDEPENDENT_AMBULATORY_CARE_PROVIDER_SITE_OTHER): Payer: BC Managed Care – PPO | Admitting: Family Medicine

## 2021-12-01 VITALS — BP 130/89 | HR 92 | Wt 240.6 lb

## 2021-12-01 DIAGNOSIS — Z3A38 38 weeks gestation of pregnancy: Secondary | ICD-10-CM

## 2021-12-01 DIAGNOSIS — Z3492 Encounter for supervision of normal pregnancy, unspecified, second trimester: Secondary | ICD-10-CM

## 2021-12-01 NOTE — Progress Notes (Signed)
? ? ?  Subjective:  ?Carol Brooks is a 26 y.o. G2P1001 at [redacted]w[redacted]d being seen today for ongoing prenatal care.  She is currently monitored for the following issues for this low-risk pregnancy and has Supervision of low-risk pregnancy, second trimester on their problem list. ? ?Patient reports no complaints. Does not want cervix checked today. Contractions: Irritability. Vag. Bleeding: None.  Movement: Present. Denies leaking of fluid.  ? ?The following portions of the patient's history were reviewed and updated as appropriate: allergies, current medications, past family history, past medical history, past social history, past surgical history and problem list.  ? ?Objective:  ? ?Vitals:  ? 12/01/21 0825  ?BP: 130/89  ?Pulse: 92  ?Weight: 240 lb 9.6 oz (109.1 kg)  ? ? ?Fetal Status: Fetal Heart Rate (bpm): 144 Fundal Height: 38 cm Movement: Present    ? ?General:  Alert, oriented and cooperative. Patient is in no acute distress.  ?Skin: Skin is warm and dry. No rash noted.   ?Cardiovascular: Normal heart rate noted  ?Respiratory: Normal respiratory effort, no problems with respiration noted  ?Abdomen: Soft, gravid, appropriate for gestational age. Pain/Pressure: Absent     ?Pelvic:  Cervical exam deferred        ?Extremities: Normal range of motion.  Edema: Trace  ?Mental Status: Normal mood and affect. Normal behavior. Normal judgment and thought content.  ? ? ?Assessment and Plan:  ?Pregnancy: G2P1001 at [redacted]w[redacted]d ? ?1. Supervision of low-risk pregnancy, second trimester ?Doing well with normal fetal movement.  ? ?2. [redacted] weeks gestation of pregnancy ?Will schedule 41 week IOL today.  ? ?Term labor symptoms and general obstetric precautions including but not limited to vaginal bleeding, contractions, leaking of fluid and fetal movement were reviewed in detail with the patient. ?Please refer to After Visit Summary for other counseling recommendations.  ? ?Return in about 1 week (around 12/08/2021) for LROB. ? ? ?Allayne Stack, DO ?

## 2021-12-02 ENCOUNTER — Telehealth (HOSPITAL_COMMUNITY): Payer: Self-pay | Admitting: *Deleted

## 2021-12-02 NOTE — Telephone Encounter (Signed)
Preadmission screen  

## 2021-12-03 ENCOUNTER — Telehealth (HOSPITAL_COMMUNITY): Payer: Self-pay | Admitting: *Deleted

## 2021-12-03 NOTE — Telephone Encounter (Signed)
Preadmission screen  

## 2021-12-04 ENCOUNTER — Encounter (HOSPITAL_COMMUNITY): Payer: Self-pay | Admitting: *Deleted

## 2021-12-04 ENCOUNTER — Telehealth (HOSPITAL_COMMUNITY): Payer: Self-pay | Admitting: *Deleted

## 2021-12-04 NOTE — Telephone Encounter (Signed)
Preadmission screen  

## 2021-12-09 ENCOUNTER — Ambulatory Visit (INDEPENDENT_AMBULATORY_CARE_PROVIDER_SITE_OTHER): Payer: BC Managed Care – PPO | Admitting: Family Medicine

## 2021-12-09 ENCOUNTER — Other Ambulatory Visit: Payer: Self-pay

## 2021-12-09 ENCOUNTER — Encounter: Payer: Self-pay | Admitting: Family Medicine

## 2021-12-09 ENCOUNTER — Inpatient Hospital Stay (HOSPITAL_COMMUNITY): Admit: 2021-12-09 | Payer: BC Managed Care – PPO

## 2021-12-09 VITALS — BP 121/81 | HR 105 | Wt 244.0 lb

## 2021-12-09 DIAGNOSIS — Z3483 Encounter for supervision of other normal pregnancy, third trimester: Secondary | ICD-10-CM

## 2021-12-09 DIAGNOSIS — Z3A40 40 weeks gestation of pregnancy: Secondary | ICD-10-CM | POA: Diagnosis not present

## 2021-12-09 DIAGNOSIS — Z3480 Encounter for supervision of other normal pregnancy, unspecified trimester: Secondary | ICD-10-CM

## 2021-12-09 NOTE — Progress Notes (Signed)
? ?  PRENATAL VISIT NOTE ? ?Subjective:  ?Carol Brooks is a 26 y.o. G2P1001 at [redacted]w[redacted]d being seen today for ongoing prenatal care.  She is currently monitored for the following issues for this low-risk pregnancy and has Supervision of other normal pregnancy, antepartum on their problem list. ? ?Patient reports occasional contractions.  Contractions: Irritability. Vag. Bleeding: None.  Movement: Present. Denies leaking of fluid.  ? ?The following portions of the patient's history were reviewed and updated as appropriate: allergies, current medications, past family history, past medical history, past social history, past surgical history and problem list.  ? ?Objective:  ? ?Vitals:  ? 12/09/21 1026  ?BP: 121/81  ?Pulse: (!) 105  ?Weight: 244 lb (110.7 kg)  ? ? ?Fetal Status: Fetal Heart Rate (bpm): 130 Fundal Height: 40 cm Movement: Present ? ?General:  Alert, oriented and cooperative. Patient is in no acute distress.  ?Skin: Skin is warm and dry. No rash noted.   ?Cardiovascular: Normal heart rate noted.  ?Respiratory: Normal respiratory effort, no problems with respiration noted.  ?Abdomen: Soft, gravid, appropriate for gestational age.       ?Pelvic: Cervical exam deferred per patient preference.   ?Extremities: Normal range of motion.    ?Mental Status: Normal mood and affect. Normal behavior. Normal judgment and thought content.  ? ?Assessment and Plan:  ?Pregnancy: G2P1001 at [redacted]w[redacted]d ? ?1. Supervision of other normal pregnancy, antepartum ?2. [redacted] weeks gestation of pregnancy ?Progressing well. FH and FHT within normal limits. Due for NST, unable to complete today. Will return in 2 days to complete NST. Has IOL scheduled for next week on 4/4.  ? ?Term labor symptoms and general obstetric precautions including but not limited to vaginal bleeding, contractions, leaking of fluid and fetal movement were reviewed in detail with the patient. ? ?Please refer to After Visit Summary for other counseling recommendations.   ? ?Return for NST on 3/30. ? ?Future Appointments  ?Date Time Provider Department Center  ?12/11/2021 10:15 AM [facility] NST [facility]  ?12/16/2021  7:30 AM [facility] SCHED ROOM [facility] None  ? ?Worthy Rancher, MD ? ?

## 2021-12-10 ENCOUNTER — Other Ambulatory Visit: Payer: Self-pay | Admitting: Advanced Practice Midwife

## 2021-12-11 ENCOUNTER — Ambulatory Visit: Payer: BC Managed Care – PPO | Admitting: *Deleted

## 2021-12-11 ENCOUNTER — Ambulatory Visit (INDEPENDENT_AMBULATORY_CARE_PROVIDER_SITE_OTHER): Payer: BC Managed Care – PPO

## 2021-12-11 VITALS — BP 123/88 | HR 87

## 2021-12-11 DIAGNOSIS — O480 Post-term pregnancy: Secondary | ICD-10-CM

## 2021-12-11 NOTE — Progress Notes (Signed)

## 2021-12-15 ENCOUNTER — Encounter (HOSPITAL_COMMUNITY): Payer: Self-pay | Admitting: Obstetrics and Gynecology

## 2021-12-15 ENCOUNTER — Inpatient Hospital Stay (HOSPITAL_COMMUNITY)
Admission: AD | Admit: 2021-12-15 | Discharge: 2021-12-16 | DRG: 807 | Disposition: A | Discharge status: Home / Self Care | Payer: BC Managed Care – PPO | Attending: Obstetrics & Gynecology | Admitting: Obstetrics & Gynecology

## 2021-12-15 DIAGNOSIS — Z3A40 40 weeks gestation of pregnancy: Secondary | ICD-10-CM

## 2021-12-15 DIAGNOSIS — O480 Post-term pregnancy: Secondary | ICD-10-CM | POA: Diagnosis not present

## 2021-12-15 LAB — COMPREHENSIVE METABOLIC PANEL
ALT: 14 U/L (ref 0–44)
AST: 21 U/L (ref 15–41)
Albumin: 3 g/dL — ABNORMAL LOW (ref 3.5–5.0)
Alkaline Phosphatase: 149 U/L — ABNORMAL HIGH (ref 38–126)
Anion gap: 5 (ref 5–15)
BUN: 8 mg/dL (ref 6–20)
CO2: 21 mmol/L — ABNORMAL LOW (ref 22–32)
Calcium: 8.9 mg/dL (ref 8.9–10.3)
Chloride: 107 mmol/L (ref 98–111)
Creatinine, Ser: 0.78 mg/dL (ref 0.44–1.00)
GFR, Estimated: >60 mL/min (ref >60)
Glucose, Bld: 103 mg/dL — ABNORMAL HIGH (ref 70–99)
Potassium: 4.4 mmol/L (ref 3.5–5.1)
Sodium: 133 mmol/L — ABNORMAL LOW (ref 135–145)
Total Bilirubin: 0.5 mg/dL (ref 0.3–1.2)
Total Protein: 6.4 g/dL — ABNORMAL LOW (ref 6.5–8.1)

## 2021-12-15 LAB — CBC
HCT: 38.5 % (ref 36.0–46.0)
Hemoglobin: 13.2 g/dL (ref 12.0–15.0)
MCH: 30.4 pg (ref 26.0–34.0)
MCHC: 34.3 g/dL (ref 30.0–36.0)
MCV: 88.7 fL (ref 80.0–100.0)
Platelets: 253 10*3/uL (ref 150–400)
RBC: 4.34 MIL/uL (ref 3.87–5.11)
RDW: 13.3 % (ref 11.5–15.5)
WBC: 16 10*3/uL — ABNORMAL HIGH (ref 4.0–10.5)
nRBC: 0 % (ref 0.0–0.2)

## 2021-12-15 LAB — TYPE AND SCREEN
ABO/RH(D): AB POS
Antibody Screen: NEGATIVE

## 2021-12-15 MED ORDER — COCONUT OIL OIL
1.0000 | TOPICAL_OIL | Status: DC | PRN
Start: 2021-12-15 — End: 2021-12-16

## 2021-12-15 MED ORDER — FUROSEMIDE 20 MG PO TABS
20.0000 mg | ORAL_TABLET | Freq: Every day | ORAL | Status: DC
  Administered 2021-12-16: 20 mg via ORAL
  Filled 2021-12-15: qty 1

## 2021-12-15 MED ORDER — PRENATAL PLUS 27-1 MG PO TABS: 1.0000 | ORAL_TABLET | Freq: Every day | ORAL | Status: DC

## 2021-12-15 MED ORDER — SOD CITRATE-CITRIC ACID 500-334 MG/5ML PO SOLN
30.0000 mL | ORAL | Status: DC | PRN
Start: 2021-12-15 — End: 2021-12-15

## 2021-12-15 MED ORDER — OXYTOCIN-SODIUM CHLORIDE 30-0.9 UT/500ML-% IV SOLN
2.5000 [IU]/h | INTRAVENOUS | Status: DC
  Administered 2021-12-15: 2.5 [IU]/h via INTRAVENOUS
  Filled 2021-12-15: qty 500

## 2021-12-15 MED ORDER — OXYTOCIN BOLUS FROM INFUSION
333.0000 mL | Freq: Once | INTRAVENOUS | Status: AC
  Administered 2021-12-15: 333 mL via INTRAVENOUS

## 2021-12-15 MED ORDER — DIBUCAINE (PERIANAL) 1 % EX OINT: 1.0000 "application " | TOPICAL_OINTMENT | CUTANEOUS | Status: DC | PRN

## 2021-12-15 MED ORDER — SIMETHICONE 80 MG PO CHEW: 80.0000 mg | CHEWABLE_TABLET | ORAL | Status: DC | PRN

## 2021-12-15 MED ORDER — ONDANSETRON HCL 4 MG/2ML IJ SOLN: 4.0000 mg | INTRAMUSCULAR | Status: DC | PRN

## 2021-12-15 MED ORDER — TETANUS-DIPHTH-ACELL PERTUSSIS 5-2.5-18.5 LF-MCG/0.5 IM SUSY: 0.5000 mL | PREFILLED_SYRINGE | Freq: Once | INTRAMUSCULAR | Status: DC

## 2021-12-15 MED ORDER — DIPHENHYDRAMINE HCL 25 MG PO CAPS: 25.0000 mg | ORAL_CAPSULE | Freq: Four times a day (QID) | ORAL | Status: DC | PRN

## 2021-12-15 MED ORDER — OXYCODONE-ACETAMINOPHEN 5-325 MG PO TABS: 2.0000 | ORAL_TABLET | ORAL | Status: DC | PRN

## 2021-12-15 MED ORDER — BENZOCAINE-MENTHOL 20-0.5 % EX AERO
1.0000 | INHALATION_SPRAY | CUTANEOUS | Status: DC | PRN
Start: 2021-12-15 — End: 2021-12-16
  Administered 2021-12-15: 1 via TOPICAL
  Filled 2021-12-15: qty 56

## 2021-12-15 MED ORDER — LACTATED RINGERS IV SOLN: 500.0000 mL | INTRAVENOUS | Status: DC | PRN

## 2021-12-15 MED ORDER — NIFEDIPINE ER OSMOTIC RELEASE 30 MG PO TB24
30.0000 mg | ORAL_TABLET | Freq: Every day | ORAL | Status: DC
  Administered 2021-12-15 – 2021-12-16 (×2): 30 mg via ORAL
  Filled 2021-12-15 (×2): qty 1

## 2021-12-15 MED ORDER — WITCH HAZEL-GLYCERIN EX PADS
1.0000 | MEDICATED_PAD | CUTANEOUS | Status: DC | PRN
Start: 2021-12-15 — End: 2021-12-16

## 2021-12-15 MED ORDER — OXYCODONE-ACETAMINOPHEN 5-325 MG PO TABS: 1.0000 | ORAL_TABLET | ORAL | Status: DC | PRN

## 2021-12-15 MED ORDER — LIDOCAINE HCL (PF) 1 % IJ SOLN: 30.0000 mL | INTRAMUSCULAR | Status: DC | PRN

## 2021-12-15 MED ORDER — ACETAMINOPHEN 325 MG PO TABS: 650.0000 mg | ORAL_TABLET | ORAL | Status: DC | PRN

## 2021-12-15 MED ORDER — ONDANSETRON HCL 4 MG PO TABS: 4.0000 mg | ORAL_TABLET | ORAL | Status: DC | PRN

## 2021-12-15 MED ORDER — LACTATED RINGERS IV SOLN: INTRAVENOUS | Status: DC

## 2021-12-15 MED ORDER — ONDANSETRON HCL 4 MG/2ML IJ SOLN: 4.0000 mg | Freq: Four times a day (QID) | INTRAMUSCULAR | Status: DC | PRN

## 2021-12-15 MED ORDER — SENNOSIDES-DOCUSATE SODIUM 8.6-50 MG PO TABS
2.0000 | ORAL_TABLET | ORAL | Status: DC
  Administered 2021-12-15 – 2021-12-16 (×2): 2 via ORAL
  Filled 2021-12-15 (×2): qty 2

## 2021-12-15 MED ORDER — PRENATAL MULTIVITAMIN CH
1.0000 | ORAL_TABLET | Freq: Every day | ORAL | Status: DC
  Administered 2021-12-15 – 2021-12-16 (×2): 1 via ORAL
  Filled 2021-12-15 (×2): qty 1

## 2021-12-15 MED ORDER — IBUPROFEN 600 MG PO TABS
600.0000 mg | ORAL_TABLET | Freq: Four times a day (QID) | ORAL | Status: DC
  Administered 2021-12-15 – 2021-12-16 (×5): 600 mg via ORAL
  Filled 2021-12-15 (×5): qty 1

## 2021-12-15 NOTE — Discharge Summary (Signed)
? ?  Postpartum Discharge Summary ? ?   ?Patient Name: Carol Brooks ?DOB: 1996/07/01 ?MRN: [id number] ? ?Date of admission: 12/15/2021 ?Delivery date:12/15/2021  ?Delivering provider:   ?Date of discharge: 12/16/2021 ? ?Admitting diagnosis: Indication for care in labor and delivery, antepartum [O75.9] ?Intrauterine pregnancy: [redacted]w[redacted]d    ?Secondary diagnosis:  Principal Problem: ?  Indication for care in labor and delivery, antepartum ? ?Additional problems: None    ?Discharge diagnosis: Term Pregnancy Delivered                                              ?Post partum procedures: None ?Augmentation: N/A ?Complications: None ? ?Hospital course: Onset of Labor With Vaginal Delivery      ?26y.o. yo G2P2002 at 419w6das admitted  after home delivery on 12/15/2021. Patient had an uncomplicated labor course as follows:  ?Membrane Rupture Time/Date: 8:30 AM ,12/15/2021   ?Delivery Method:Vaginal, Spontaneous  at home ?Episiotomy: None  ?Lacerations:  1st degree  ?She was admitted after home delivery with ROM at delivery, meconium stained per patient report. Placenta at introitus upon admission and delivered with gentle traction and pitocin administered. Excellent hemostasis achieved, and first degree perineal tear found to be hemostatic and thus not repaired. ?Patient had an uncomplicated postpartum course.  She is ambulating, tolerating a regular diet, passing flatus, and urinating well. Patient is discharged home in stable condition on 12/16/21. Her blood pressures were elevated during the postpartum period and blood pressure medication was started. She is tolerating it well. ? ?Newborn Data: ?Birth date:12/15/2021  ?Birth time:8:30 AM  ?Gender:Female  ?Living status:Living  ?Apgars: ,  ?Weight:2800 g  ? ?Magnesium Sulfate received: No ?BMZ received: No ?Rhophylac:No ?MMR:No ?T-DaP:Given prenatally ?Flu: Yes ?Transfusion:No ? ?Physical exam  ?Vitals:  ? 12/15/21 1606 12/15/21 2034 12/15/21 2318 12/16/21 0543  ?BP: (!) 142/97 (!)  117/91 128/83 109/76  ?Pulse: 88 97 95 91  ?Resp: _0 ?Temp: 97.9 ?F (36.6 ?C) 98.1 ?F (36.7 ?C) 98 ?F (36.7 ?C) 98.3 ?F (36.8 ?C)  ?TempSrc: Oral Oral  Oral  ?SpO2: 99% 100% 98% 100%  ?Weight:      ?Height:      ? ?General: alert, cooperative, and no distress ?Lochia: appropriate ?Uterine Fundus: firm ?Incision: N/A ?DVT Evaluation: No cords or calf tenderness. ?No significant calf/ankle edema. ?Labs: ?Lab Results  ?Component Value Date  ? WBC 16.0 (H) 12/15/2021  ? HGB 13.2 12/15/2021  ? HCT 38.5 12/15/2021  ? MCV 88.7 12/15/2021  ? PLT 253 12/15/2021  ? ? ?  Latest Ref Rng & Units 12/15/2021  ? 10:39 AM  ?CMP  ?Glucose 70 - 99 mg/dL 103    ?BUN 6 - 20 mg/dL 8    ?Creatinine 0.44 - 1.00 mg/dL 0.78    ?Sodium 135 - 145 mmol/L 133    ?Potassium 3.5 - 5.1 mmol/L 4.4    ?Chloride 98 - 111 mmol/L 107    ?CO2 22 - 32 mmol/L 21    ?Calcium 8.9 - 10.3 mg/dL 8.9    ?Total Protein 6.5 - 8.1 g/dL 6.4    ?Total Bilirubin 0.3 - 1.2 mg/dL 0.5    ?Alkaline Phos 38 - 126 U/L 149    ?AST 15 - 41 U/L 21    ?ALT 0 - 44 U/L 14    ? ?Edinburgh Score: ? ?  12/15/2021  ?  8:34 PM  ?Flavia Shipper Postnatal Depression Scale Screening Tool  ?I have been able to laugh and see the funny side of things. 0  ?I have looked forward with enjoyment to things. 0  ?I have blamed myself unnecessarily when things went wrong. 0  ?I have been anxious or worried for no good reason. 0  ?I have felt scared or panicky for no good reason. 0  ?Things have been getting on top of me. 0  ?I have been so unhappy that I have had difficulty sleeping. 0  ?I have felt sad or miserable. 0  ?I have been so unhappy that I have been crying. 0  ?The thought of harming myself has occurred to me. 0  ?Edinburgh Postnatal Depression Scale Total 0  ? ? ? ?After visit meds:  ?Allergies as of 12/16/2021   ?No Known Allergies ?  ? ?  ?Medication List  ?  ? ?TAKE these medications   ? ?furosemide 20 MG tablet ?Commonly known as: LASIX ?Take 1 tablet (20 mg total) by mouth daily for  5 days. ?  ?ibuprofen 600 MG tablet ?Commonly known as: ADVIL ?Take 1 tablet (600 mg total) by mouth every 6 (six) hours. ?  ?NIFEdipine 30 MG 24 hr tablet ?Commonly known as: ADALAT CC ?Take 1 tablet (30 mg total) by mouth daily. ?  ?prenatal vitamin w/FE, FA 27-1 MG Tabs tablet ?Take 1 tablet by mouth daily at 12 noon. ?  ? ?  ? ? ? ?Discharge home in stable condition ?Infant Feeding: Breast ?Infant Disposition:home with mother pending pediatric evaluation on day of discharge ?Discharge instruction: per After Visit Summary and Postpartum booklet. ?Activity: Advance as tolerated. Pelvic rest for 6 weeks.  ?Diet: routine diet ?Future Appointments: ?Future Appointments  ?Date Time Provider [place]  ?01/26/2022  3:15 PM Woodroe Mode, MD [facility]  ? ?Follow up Visit: ? Follow-up Information   ? ? [facility] for [facility] Follow up.   ?Specialty: Obstetrics and Gynecology ?Contact information: ?[address] ?[place] [address] ?[phone] ? ?  ?  ? ?  ?  ? ?  ? ? ?Message sent to schedulers by Dr Thompson Grayer: ?Please schedule this patient for a In person postpartum visit in 6 weeks with the following provider: Any provider. ?Additional Postpartum F/U: None   ?Low risk pregnancy complicated by:  None ?Delivery mode:  Vaginal, Spontaneous  ?Anticipated Birth Control:  POPs ? ? ?12/16/2021 ?Truett Mainland, DO ?12/16/2021 ?10:16 AM ? ? ? ? ? ?

## 2021-12-15 NOTE — H&P (Addendum)
OBSTETRIC ADMISSION HISTORY AND PHYSICAL ? ?Carol Brooks is a 26 y.o. female G2P1001 with IUP at [redacted]w[redacted]d by LMP presenting for home delivery with still needing to deliver placenta. She reports +Fms, and contractions that started home early this morning and intensified by 0730 this AM. She then felt pressure while sitting on the toilet, got on all fours, and delivered her baby by herself. Placenta was clamped, she notes minimal bleeding, and presents now to deliver placenta. no blurry vision, headaches or peripheral edema, and RUQ pain.  She plans on breast feeding. She requests POPs for birth control. ?She received her prenatal [facility] at [facility]  ? ?Dating: By LMP --->  Estimated Date of Delivery: 12/09/21 ? ?Sono:   ? ?@[redacted]w[redacted]d , CWD, normal anatomy, cephalic presentation,  540g, EFW ? ? ?Prenatal History/Complications:  ? ? ?Past Medical History: ?History reviewed. No pertinent past medical history. ? ?Past Surgical History: ?History reviewed. No pertinent surgical history. ? ?Obstetrical History: ?OB History   ? ? Gravida  ?2  ? Para  ?1  ? Term  ?1  ? Preterm  ?0  ? AB  ?0  ? Living  ?1  ?  ? ? SAB  ?0  ? IAB  ?0  ? Ectopic  ?0  ? Multiple  ?0  ? Live Births  ?1  ?   ?  ?  ? ? ?Social History ?Social History  ? ?Socioeconomic History  ? Marital status: Significant Other  ?  Spouse name: Not on file  ? Number of children: Not on file  ? Years of education: Not on file  ? Highest education level: Not on file  ?Occupational History  ? Occupation: 96%  ?  Comment: [employer]  ?Tobacco Use  ? Smoking status: Never  ? Smokeless tobacco: Never  ?Vaping Use  ? Vaping Use: Never used  ?Substance and Sexual Activity  ? Alcohol use: Not Currently  ?  Comment: 0-1/week  ? Drug use: Not Currently  ?  Types: Marijuana  ?  Comment: last use of weed july 2022  ? Sexual activity: Yes  ?  Birth control/protection: None  ?Other Topics Concern  ? Not on file  ?Social History Narrative  ?  Not on file  ? ?Social Determinants of Health  ? ?Financial Resource Strain: Not on file  ?Food Insecurity: No Food Insecurity  ? Worried About August 2022 in the Last Year: Never true  ? Ran Out of Food in the Last Year: Never true  ?Transportation Needs: No Transportation Needs  ? Lack of Transportation (Medical): No  ? Lack of Transportation (Non-Medical): No  ?Physical Activity: Not on file  ?Stress: Not on file  ?Social Connections: Not on file  ? ? ?Family History: ?Family History  ?Problem Relation Age of Onset  ? Hypertension Father   ? Lung cancer Maternal Grandmother   ? Hypertension Maternal Grandfather   ? Breast cancer Maternal Great-grandmother   ? ? ?Allergies: ?No Known Allergies ? ?Pt denies allergies to latex, iodine, or shellfish. ? ?Medications Prior to Admission  ?Medication Sig Dispense Refill Last Dose  ? prenatal vitamin w/FE, FA (PRENATAL 1 + 1) 27-1 MG TABS tablet Take 1 tablet by mouth daily at 12 noon.     ? ? ? ?Review of Systems  ? ?All systems reviewed and negative except as stated in HPI ? ?Blood pressure (!) 138/97, pulse (!) 118, resp. rate 18, height 5\' 9"  (  1.753 m), weight 106.6 kg, last menstrual period 03/04/2021. ?General appearance: alert, cooperative, and appears stated age ?Lungs: clear to auscultation bilaterally ?Heart: regular rate and rhythm ?Abdomen: soft, non-tender; bowel sounds normal ?Pelvic: first degree perineal, hemostatic, cord protruding from vaginal with clamp in place, minimal bleeding ?Extremities: Homans sign is negative, no sign of DVT ? ?Prenatal labs: ?ABO, Rh: AB/Positive/-- (10/17 1413) ?Antibody: Negative (01/06 0827) ?Rubella: 3.79 (10/17 1413) ?RPR: Non Reactive (01/06 0826)  ?HBsAg: Negative (10/17 1413)  ?HIV: Non Reactive (01/06 0826)  ?GBS: Negative/-- (03/03 1152)  ?2 hr Glucola: passed. normal ?Genetic screening:  AFP screen negative, Panorama NIPS LR, horizon negative ?Anatomy US: initial anatomy US suggestive of VSD, but follow up US  no evidence of VSD ? ?Prenatal Transfer Tool  ?Maternal Diabetes: No ?Genetic Screening: Normal ?Maternal Ultrasounds/Referrals: Other:concern for VSD on initial anatomy US, but follow up US without signs of VSD ?Fetal Ultrasounds or other Referrals:  None ?Maternal Substance Abuse:  No ?Significant Maternal Medications:  None ?Significant Maternal Lab Results: None ? ?No results found for this or any previous visit (from the past 24 hour(s)). ? ?Patient Active Problem List  ? Diagnosis Date Noted  ? Indication for care in labor and delivery, antepartum 12/15/2021  ? Supervision of other normal pregnancy, antepartum 06/11/2021  ? ? ?Assessment/Plan:  ?Carol Brooks is a 26 y.o. G2P1001 at [redacted]w[redacted]d here for home delivery, still needing to deliver placenta.  ? ?#Labor: Delivered at home, placenta retained with clamp in place. Delivered in one push, with large clot post delivery so pitocin IV started. See delivery note for details. ?#Pain: Controlled right now, postpartum meds ?#FWB: Infant doing well, delivered at home ?#ID:  Negative ?#MOF: breast ?#MOC: POPs ?#Circ:  Desires ? ?#Elevated BP without diagnosis of HTN- possibly due to un-medicated birth and pain. She is asymptomatic. Pre-E labs pending, will monitor BP closely.  ? ?Billey Co, MD  ?Center for [facility], [facility] ?12/15/2021, 10:31 AM ? ? ? ?

## 2021-12-15 NOTE — Progress Notes (Signed)
Pt delivered viable baby boy at home.  No APGARS given since no medical personnel present at time of delivery.    ?

## 2021-12-16 ENCOUNTER — Inpatient Hospital Stay (HOSPITAL_COMMUNITY): Payer: BC Managed Care – PPO

## 2021-12-16 ENCOUNTER — Other Ambulatory Visit (HOSPITAL_COMMUNITY): Payer: Self-pay

## 2021-12-16 LAB — RPR: RPR Ser Ql: NONREACTIVE

## 2021-12-16 MED ORDER — NIFEDIPINE ER 30 MG PO TB24
30.0000 mg | ORAL_TABLET | Freq: Every day | ORAL | 3 refills | Status: DC
  Filled 2021-12-16: qty 30, 30d supply, fill #0

## 2021-12-16 MED ORDER — FUROSEMIDE 20 MG PO TABS
20.0000 mg | ORAL_TABLET | Freq: Every day | ORAL | 0 refills | Status: DC
  Filled 2021-12-16: qty 5, 5d supply, fill #0

## 2021-12-16 MED ORDER — IBUPROFEN 600 MG PO TABS
600.0000 mg | ORAL_TABLET | Freq: Four times a day (QID) | ORAL | 0 refills | Status: AC
Start: 2021-12-16 — End: ?
  Filled 2021-12-16: qty 30, 8d supply, fill #0

## 2021-12-16 NOTE — Progress Notes (Signed)
POSTPARTUM PROGRESS NOTE ? ?Post Partum Day 1 ? ?Subjective: ? ?Carol Brooks is a 26 y.o. [id number] s/p SVD at [redacted]w[redacted]d.  She reports she is doing well. No acute events overnight. She denies any problems with ambulating, voiding or po intake. Denies nausea or vomiting.  Pain is well controlled.  Lochia is mild. ? ?Objective: ?Blood pressure 109/76, pulse 91, temperature 98.3 ?F (36.8 ?C), temperature source Oral, resp. rate 16, height 5\' 9"  (1.753 m), weight 106.6 kg, last menstrual period 03/04/2021, SpO2 100 %, unknown if currently breastfeeding. ? ?Physical Exam:  ?General: alert, cooperative and no distress ?Chest: no respiratory distress ?Heart:regular rate, distal pulses intact ?Uterine Fundus: firm, appropriately tender ?DVT Evaluation: No calf swelling or tenderness ?Extremities: no lower extremity edema ?Skin: warm, dry ? ?Recent Labs  ?  12/15/21 ?1039  ?HGB 13.2  ?HCT 38.5  ? ? ?Assessment/Plan: ?Carol Brooks is a 26 y.o. [id number] s/p SVD at [redacted]w[redacted]d  ? ?PPD#1 - Doing well ? Routine postpartum care ? ?Contraception: POPs ?Feeding: Breast ?Dispo: Plan for discharge likely discharge tomorrow as infant will require 48-72 hours of observation per pediatrics ? ?#Elevated PP BP: BP normal overnight following Procardia 30mg . Starting Lasix 20mg  for 5 days this AM. Continue to monitor BP.  ? ? LOS: 1 day  ? ?Daniel Nones, MD ?FM PGY-1 ?12/16/2021, 8:36 AM ? ?

## 2021-12-17 ENCOUNTER — Telehealth: Payer: Self-pay

## 2021-12-17 NOTE — Telephone Encounter (Signed)
Transition Care Management Unsuccessful Follow-up Telephone Call ? ?Date of discharge and from where:  12/16/2021-[facility] ? ?Attempts:  1st Attempt ? ?Reason for unsuccessful TCM follow-up call:  Left voice message ? ?  ?

## 2021-12-19 NOTE — Telephone Encounter (Signed)
Transition Care Management Unsuccessful Follow-up Telephone Call ? ?Date of discharge and from where:  12/16/2021-[facility]  ? ?Attempts:  2nd Attempt ? ?Reason for unsuccessful TCM follow-up call:  Left voice message ? ?  ?

## 2021-12-22 NOTE — Progress Notes (Signed)
Patient was assessed and managed by nursing staff during this encounter. I have reviewed the chart and agree with the documentation and plan. I have also made any necessary editorial changes. ? ?Emeterio Reeve, MD ?12/22/2021 3:29 PM  ? ?

## 2021-12-22 NOTE — Telephone Encounter (Signed)
Transition Care Management Unsuccessful Follow-up Telephone Call ? ?Date of discharge and from where:  12/16/2021-[facility]  ? ?Attempts:  3rd Attempt ? ?Reason for unsuccessful TCM follow-up call:  Left voice message ? ?  ?

## 2021-12-26 ENCOUNTER — Telehealth (HOSPITAL_COMMUNITY): Payer: Self-pay | Admitting: *Deleted

## 2021-12-26 NOTE — Telephone Encounter (Signed)
Left phone voicemail message. ? ?Duffy Rhody, RN 12-26-2021 at 11:45am ?

## 2022-01-26 ENCOUNTER — Telehealth (INDEPENDENT_AMBULATORY_CARE_PROVIDER_SITE_OTHER): Payer: BC Managed Care – PPO | Admitting: Obstetrics & Gynecology

## 2022-01-26 DIAGNOSIS — Z30011 Encounter for initial prescription of contraceptive pills: Secondary | ICD-10-CM

## 2022-01-26 MED ORDER — NORGESTIMATE-ETH ESTRADIOL 0.25-35 MG-MCG PO TABS: 1.0000 | ORAL_TABLET | Freq: Every day | ORAL | 11 refills | Status: DC

## 2022-01-26 NOTE — Progress Notes (Signed)
Provider location: [facility] for [facility]   Patient location: Home  I connected on 01/26/22 at  3:15 PM EDT by Mychart Video Encounter and verified that I am speaking with the correct person using two identifiers.       I discussed the limitations, risks, security and privacy concerns of performing an evaluation and management service virtually and the availability of in person appointments. I also discussed with the patient that there may be a patient responsible charge related to this service. The patient expressed understanding and agreed to proceed.  Post Partum Visit Note Subjective:   Carol Brooks is a 26 y.o. G78P2002 female who presents for a postpartum visit. She is 6 weeks postpartum following a normal spontaneous vaginal delivery at home.  I have fully reviewed the prenatal and intrapartum course. The delivery was at 40.6 gestational weeks.  Anesthesia: none. Postpartum course has been good. Baby is doing well. Baby is feeding by bottle - Enfamil Gentle Ease . Bleeding no bleeding. Bowel function is normal. Bladder function is normal. Patient is not sexually active. Contraception method is oral progesterone-only contraceptive. Postpartum depression screening: negative.   The pregnancy intention screening data noted above was reviewed. Potential methods of contraception were discussed. The patient elected to proceed with No data recorded.   Edinburgh Postnatal Depression Scale - 01/26/22 1517       Edinburgh Postnatal Depression Scale:  In the Past 7 Days   I have been able to laugh and see the funny side of things. 0    I have looked forward with enjoyment to things. 0    I have blamed myself unnecessarily when things went wrong. 0    I have been anxious or worried for no good reason. 0    I have felt scared or panicky for no good reason. 0    Things have been getting on top of me. 0    I have been so unhappy that I have had difficulty  sleeping. 0    I have felt sad or miserable. 0    I have been so unhappy that I have been crying. 0    The thought of harming myself has occurred to me. 0    Edinburgh Postnatal Depression Scale Total 0             The following portions of the patient's history were reviewed and updated as appropriate: allergies, current medications, past family history, past medical history, past social history, past surgical history, and problem list.  Review of Systems Pertinent items are noted in HPI.  Objective:  LMP 03/04/2021 (Exact Date)     General:  Alert, oriented and cooperative. Patient is in no acute distress.  Respiratory: Normal respiratory effort, no problems with respiration noted  Mental Status: Normal mood and affect. Normal behavior. Normal judgment and thought content.  Rest of physical exam deferred due to type of encounter   Assessment:    Normal virtual postpartum exam.  Plan:  Essential components of care per ACOG recommendations:  1.  Mood and well being: Patient with negative depression screening today. Reviewed local resources for support.  - Patient does not use tobacco.  - hx of drug use? No    2. Infant care and feeding:  -Patient currently breastmilk feeding? No  -Social determinants of health (SDOH) reviewed in EPIC. No concerns  3. Sexuality, contraception and birth spacing - Patient does not want a pregnancy in the next year.  Desired family size is 2 children.  - Reviewed forms of contraception in tiered fashion. Patient desired oral contraceptives (estrogen/progesterone) today.   - Discussed birth spacing of 18 months  4. Sleep and fatigue -Encouraged family/partner/community support of 4 hrs of uninterrupted sleep to help with mood and fatigue  5. Physical Recovery  - Discussed patients delivery  - Patient had a no laceration, perineal healing reviewed. Patient expressed understanding - Patient has urinary incontinence? No  - Patient is safe to  resume physical and sexual activity  6.  Health Maintenance - Last pap smear done at [facility] and is up-to-date and was normal     I provided 10 minutes of face-to-face time during this encounter.    No follow-ups on file.  No future appointments.  Adam Phenix, MD  Center for [facility], [facility]

## 2022-05-05 DIAGNOSIS — Z79899 Other long term (current) drug therapy: Secondary | ICD-10-CM | POA: Diagnosis not present

## 2022-05-05 DIAGNOSIS — Z3200 Encounter for pregnancy test, result unknown: Secondary | ICD-10-CM | POA: Diagnosis not present

## 2022-05-05 DIAGNOSIS — Z6832 Body mass index (BMI) 32.0-32.9, adult: Secondary | ICD-10-CM | POA: Diagnosis not present

## 2022-05-05 DIAGNOSIS — Z789 Other specified health status: Secondary | ICD-10-CM | POA: Diagnosis not present

## 2022-09-03 IMAGING — US US FETAL BPP W/ NON-STRESS
1 series · 14 of 14 positions shown · non-contrast
Comparison: none

[Series 1: us fetal bpp w/ non-stress · 14 acquisitions, 14 frames shown]
[im 1/14]
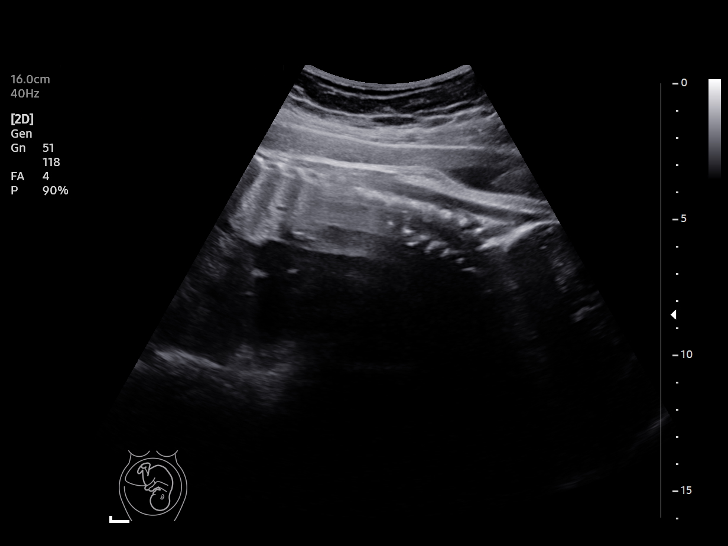
[im 2/14]
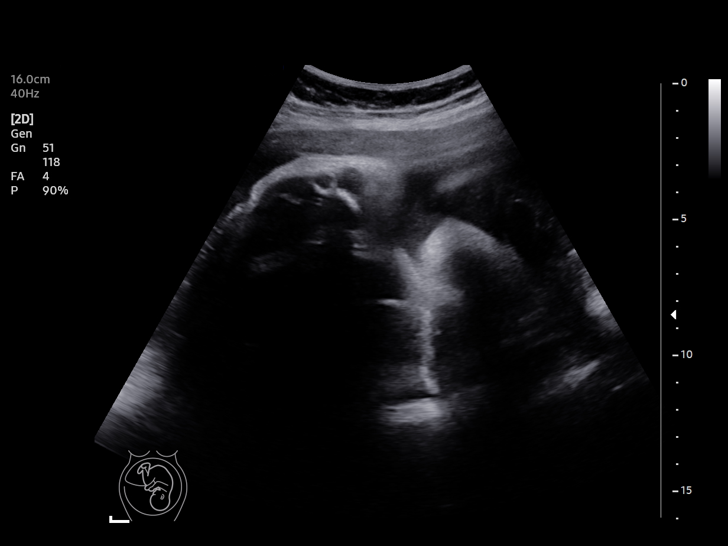
[im 3/14]
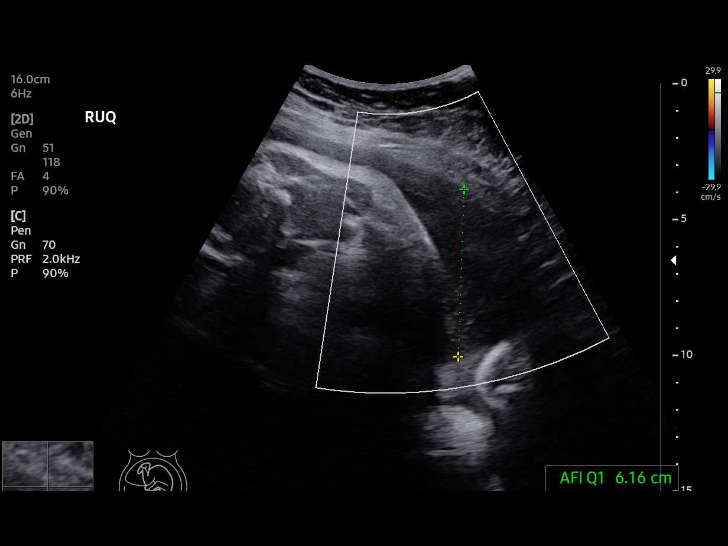
[im 4/14]
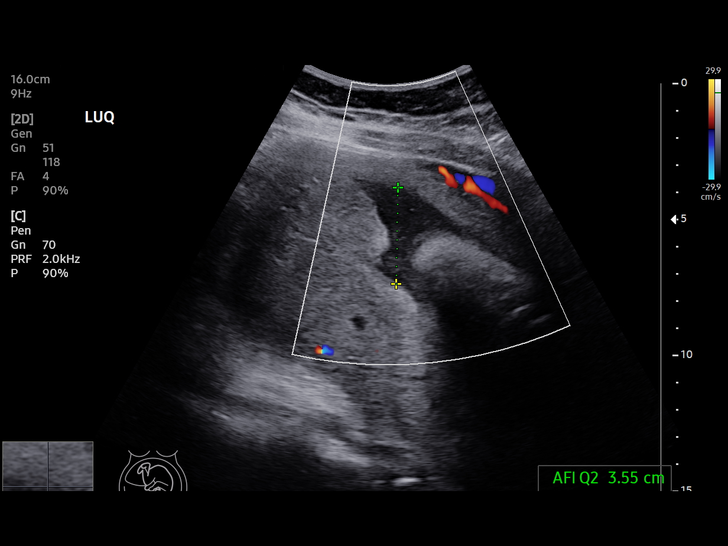
[im 5/14]
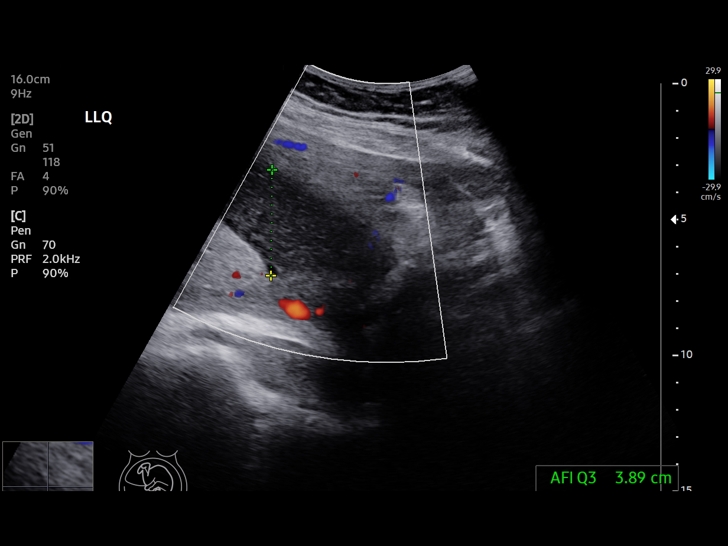
[im 6/14]
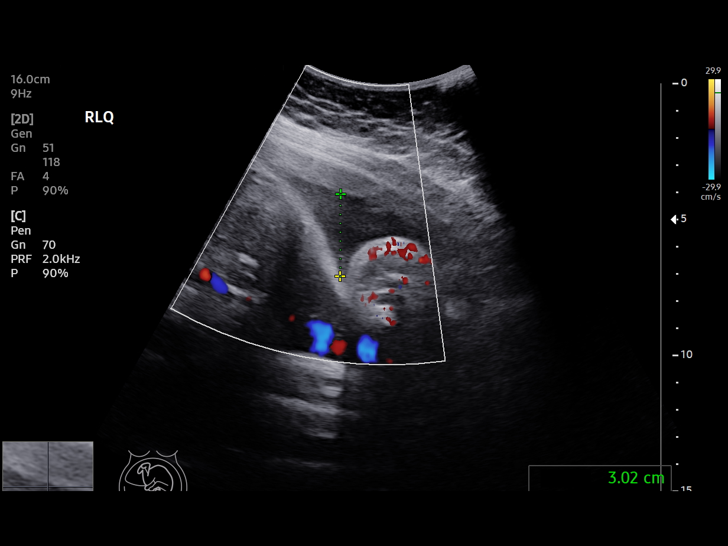
[im 7/14]
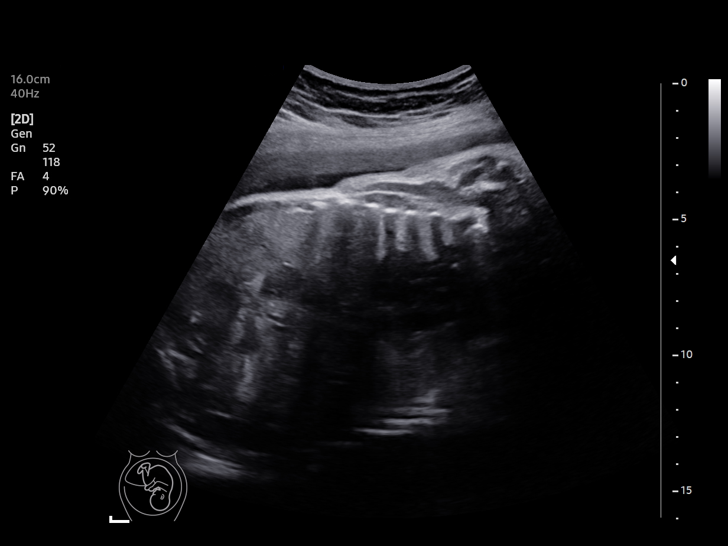
[im 8/14]
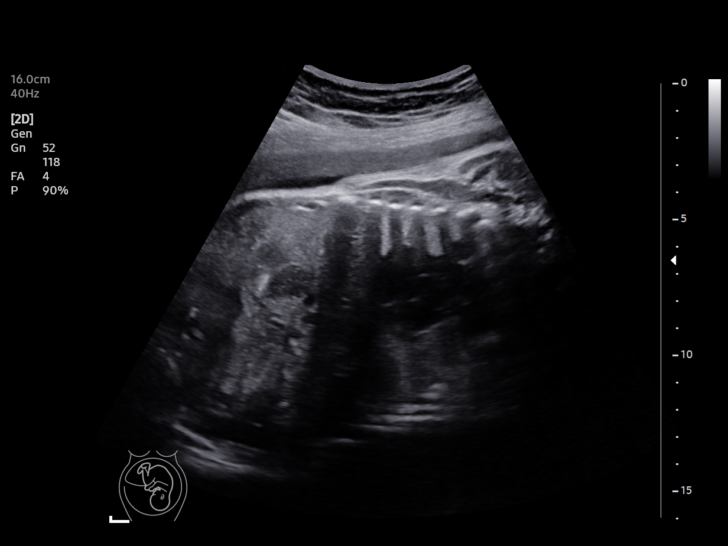
[im 9/14]
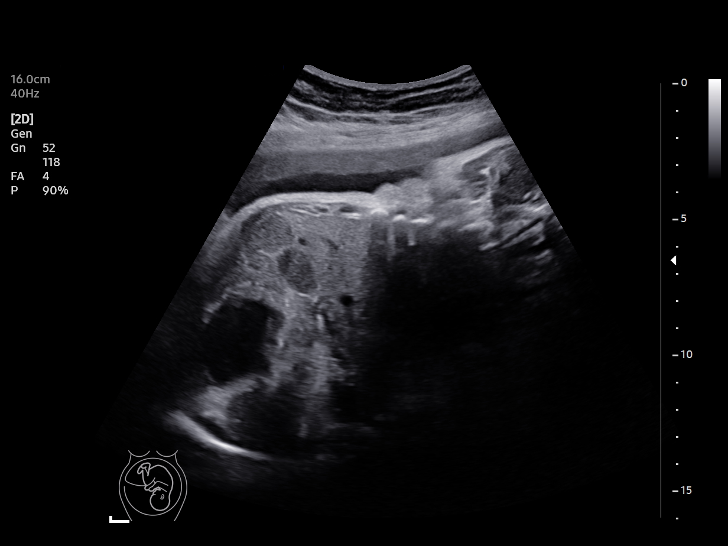
[im 10/14]
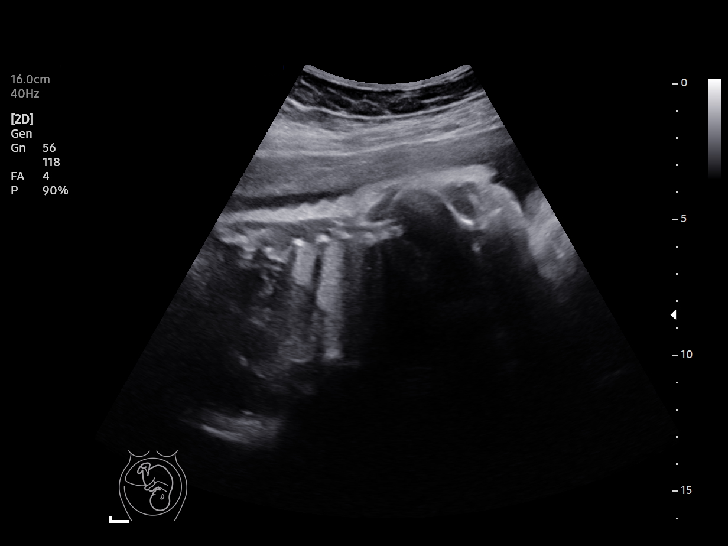
[im 11/14]
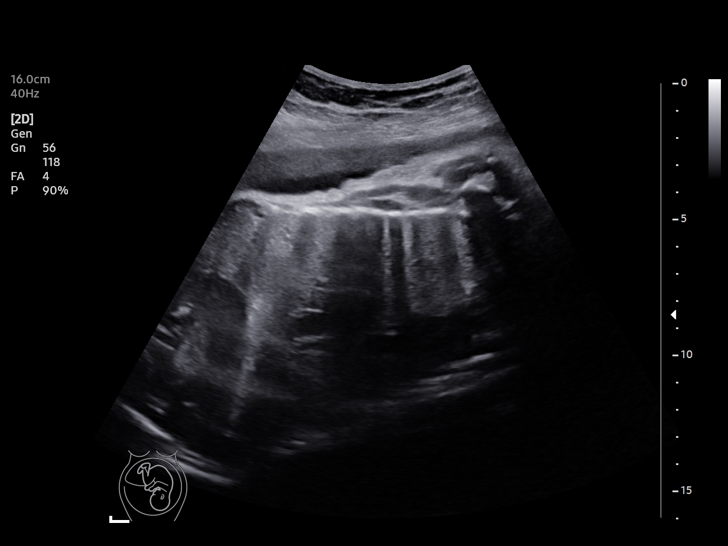
[im 12/14]
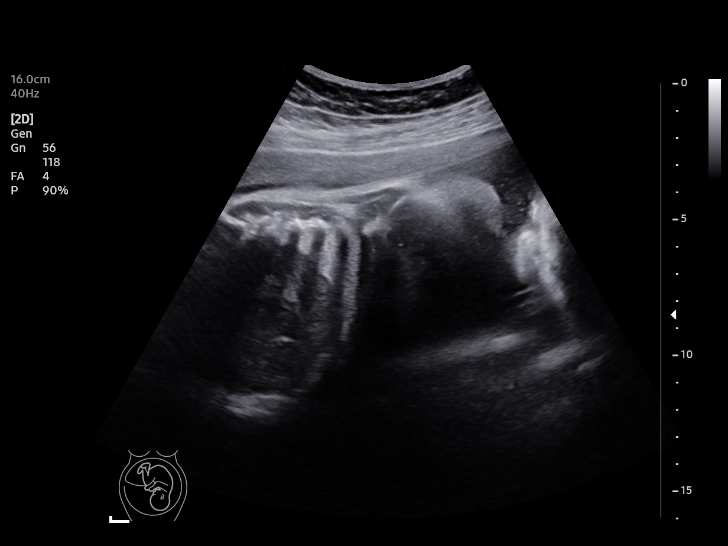
[im 13/14]
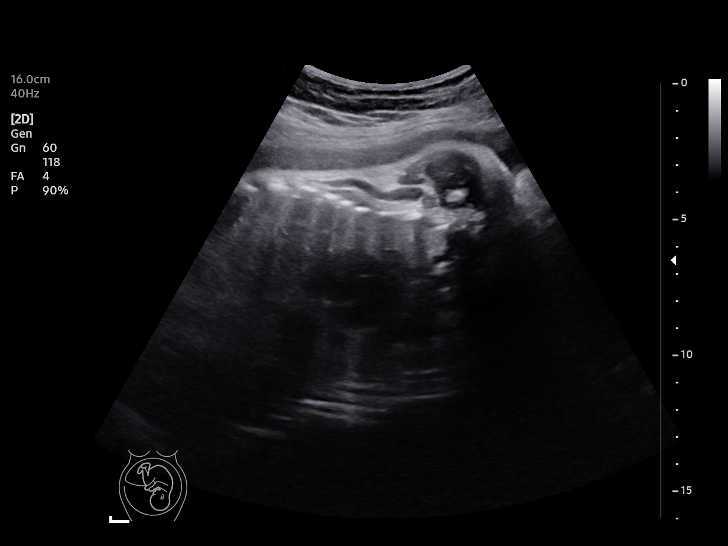
[im 14/14]
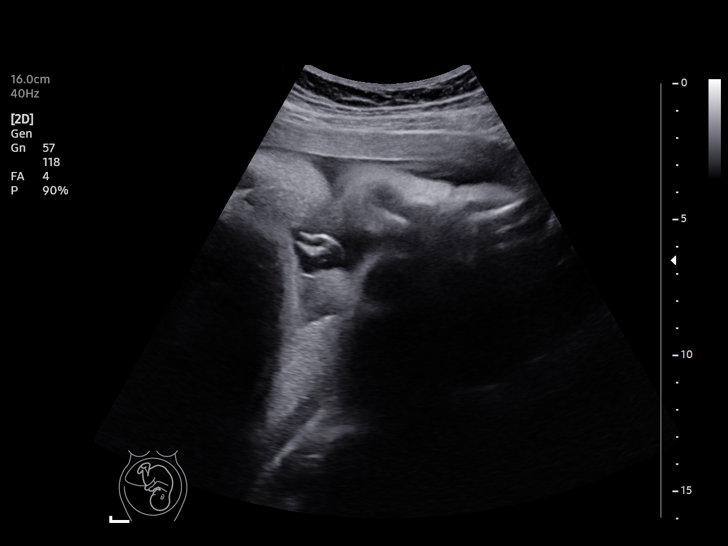

[14 of 14 positions shown; findings below may reference images not displayed]

[facility] [HOSPITAL]
                                                             [REDACTED]care at
                   [facility]

 1  US FETAL BPP W/NONSTRESS              76818.4     NIVIRUS DATABEX

Service(s) Provided

Indications

 40 weeks gestation of pregnancy
 Postdate pregnancy (40-42 weeks)
Fetal Evaluation

 Num Of Fetuses:          1
 Preg. Location:          Intrauterine
 Cardiac Activity:        Observed
 Presentation:            Cephalic

 Amniotic Fluid
 AFI FV:      Within normal limits

 AFI Sum(cm)     %Tile       Largest Pocket(cm)
 16.62           73

 RUQ(cm)       RLQ(cm)       LUQ(cm)        LLQ(cm)

Biophysical Evaluation

 Amniotic F.V:   Pocket => 2 cm             F. Tone:         Observed
 F. Movement:    Observed                   N.S.T:           Reactive
 F. Breathing:   Observed                   Score:           [DATE]
OB History

 Gravidity:    2
 Living:       1
Gestational Age

 LMP:           40w 2d        Date:  03/04/21                  EDD:   12/09/21
 Best:          40w 2d     Det. By:  LMP  (03/04/21)          EDD:   12/09/21
Impression

 NST is reactive. BPP [DATE].
Recommendations

 Delivery by 41 weeks

## 2022-10-07 ENCOUNTER — Ambulatory Visit (INDEPENDENT_AMBULATORY_CARE_PROVIDER_SITE_OTHER): Payer: Medicaid Other | Admitting: Obstetrics and Gynecology

## 2022-10-07 ENCOUNTER — Other Ambulatory Visit (HOSPITAL_COMMUNITY)
Admission: RE | Admit: 2022-10-07 | Discharge: 2022-10-07 | Disposition: A | Discharge status: Home / Self Care | Payer: Medicaid Other | Source: Ambulatory Visit | Attending: Obstetrics and Gynecology | Admitting: Obstetrics and Gynecology

## 2022-10-07 ENCOUNTER — Encounter: Payer: Self-pay | Admitting: Obstetrics and Gynecology

## 2022-10-07 VITALS — BP 119/67 | HR 66 | Wt 216.0 lb

## 2022-10-07 DIAGNOSIS — N920 Excessive and frequent menstruation with regular cycle: Secondary | ICD-10-CM

## 2022-10-07 DIAGNOSIS — N898 Other specified noninflammatory disorders of vagina: Secondary | ICD-10-CM | POA: Diagnosis not present

## 2022-10-07 NOTE — Progress Notes (Signed)
    GYNECOLOGY VISIT  Patient name: Carol Brooks MRN [id number]  Date of birth: May 11, 1996 Chief Complaint:   Gynecologic Exam   History:  Carol Brooks is a 27 y.o. G2P2002 being seen today for irregular bleeding. On BCP in August x1 month - flow is 4-5 days and feels they are more frequently, feels like bleeding every 2 weeks. No pain and no clots. Does not like birth control - may have had BV in Nov. Self swab today    Have a regular bleed, then 2-3 days of light bleeding,  then about 2 weeks later will have light bleeding. This month has not had the light bleeding occur as of yet.  This occurred from September - December. Not breast feeding. No abnormal discharge.   Current contraception - EC, pull out method and eventual vasectomy   No past medical history on file.  No past surgical history on file.  The following portions of the patient's history were reviewed and updated as appropriate: allergies, current medications, past family history, past medical history, past social history, past surgical history and problem list.      Component Value Date/Time   DIAGPAP  03/11/2017 0000    NEGATIVE FOR INTRAEPITHELIAL LESIONS OR MALIGNANCY.   DIAGPAP  03/11/2017 0000    A LETTER WAS SENT TO THE PATIENT INFORMING HER OF THE ABOVE RESULTS.   ADEQPAP  03/11/2017 0000    Satisfactory for evaluation  endocervical/transformation zone component PRESENT.     Review of Systems:  Pertinent items are noted in HPI. Comprehensive review of systems was otherwise negative.   Objective:  Physical Exam BP 119/67   Pulse 66   Wt 216 lb (98 kg)   LMP 10/01/2022 (Exact Date)   Breastfeeding Unknown   BMI 31.90 kg/m    Physical Exam Vitals and nursing note reviewed.  Constitutional:      Appearance: Normal appearance.  HENT:     Head: Normocephalic and atraumatic.  Pulmonary:     Effort: Pulmonary effort is normal.  Skin:    General: Skin is warm and dry.  Neurological:      General: No focal deficit present.     Mental Status: She is alert.  Psychiatric:        Mood and Affect: Mood normal.        Behavior: Behavior normal.        Thought Content: Thought content normal.        Judgment: Judgment normal.    Labs and Imaging No results found.     Assessment & Plan:   1. Spotting Pattern sounds like mid-cycle/ovulatory spotting with regular cycle length. Reviewed normal to have occasional spotting with ovulation. Noted that cycle seems to be appropriate length and to be mindful that with midcycle spotting, likely ovulating and highest chance of spontaneous conception. Partner will be undergoing vasectomy for contraception.   2. Vaginal itching Self swab today  - Cervicovaginal ancillary only  Routine preventative health maintenance measures emphasized.  Darliss Cheney, MD [facility]

## 2022-10-08 ENCOUNTER — Telehealth: Payer: Self-pay | Admitting: Family Medicine

## 2022-10-08 ENCOUNTER — Other Ambulatory Visit: Payer: Self-pay | Admitting: Obstetrics and Gynecology

## 2022-10-08 DIAGNOSIS — N898 Other specified noninflammatory disorders of vagina: Secondary | ICD-10-CM

## 2022-10-08 LAB — CERVICOVAGINAL ANCILLARY ONLY
Bacterial Vaginitis (gardnerella): NEGATIVE
Candida Glabrata: NEGATIVE
Candida Vaginitis: POSITIVE — AB
Comment: NEGATIVE
Comment: NEGATIVE
Comment: NEGATIVE

## 2022-10-08 MED ORDER — FLUCONAZOLE 150 MG PO TABS: 150.0000 mg | ORAL_TABLET | Freq: Once | ORAL | 3 refills | Status: AC

## 2022-10-08 NOTE — Telephone Encounter (Signed)
Called patient to get her scheduled but there was no answer. Left Voicemail

## 2022-10-12 HISTORY — PX: BREAST ENHANCEMENT SURGERY: SHX7

## 2022-11-16 ENCOUNTER — Other Ambulatory Visit (HOSPITAL_COMMUNITY)
Admission: RE | Admit: 2022-11-16 | Discharge: 2022-11-16 | Disposition: A | Discharge status: Home / Self Care | Payer: Medicaid Other | Source: Ambulatory Visit | Attending: Obstetrics and Gynecology | Admitting: Obstetrics and Gynecology

## 2022-11-16 ENCOUNTER — Ambulatory Visit (INDEPENDENT_AMBULATORY_CARE_PROVIDER_SITE_OTHER): Payer: Medicaid Other | Admitting: Obstetrics and Gynecology

## 2022-11-16 ENCOUNTER — Encounter: Payer: Self-pay | Admitting: Obstetrics and Gynecology

## 2022-11-16 ENCOUNTER — Other Ambulatory Visit: Payer: Self-pay

## 2022-11-16 VITALS — BP 119/81 | HR 73 | Ht 69.0 in | Wt 207.0 lb

## 2022-11-16 DIAGNOSIS — Z124 Encounter for screening for malignant neoplasm of cervix: Secondary | ICD-10-CM

## 2022-11-16 DIAGNOSIS — Z0000 Encounter for general adult medical examination without abnormal findings: Secondary | ICD-10-CM | POA: Diagnosis not present

## 2022-11-16 NOTE — Progress Notes (Unsigned)
    GYNECOLOGY VISIT  Patient name: Carol Brooks MRN [id number]  Date of birth: [birth year] Chief Complaint:   Gynecologic Exam  History:  Carol Brooks is a 27 y.o. G2P2002 being seen today for pap. Last pap done 2018, none since.     No past medical history on file.  No past surgical history on file.  The following portions of the patient's history were reviewed and updated as appropriate: allergies, current medications, past family history, past medical history, past social history, past surgical history and problem list.   Health Maintenance:   Last pap     Component Value Date/Time   DIAGPAP  03/11/2017 0000    NEGATIVE FOR INTRAEPITHELIAL LESIONS OR MALIGNANCY.   DIAGPAP  03/11/2017 0000    A LETTER WAS SENT TO THE PATIENT INFORMING HER OF THE ABOVE RESULTS.   ADEQPAP  03/11/2017 0000    Satisfactory for evaluation  endocervical/transformation zone component PRESENT.    Last mammogram: n/a   Review of Systems:  Pertinent items are noted in HPI. Comprehensive review of systems was otherwise negative.   Objective:  Physical Exam BP 119/81   Pulse 73   Ht '5\' 9"'$  (1.753 m)   Wt 207 lb (93.9 kg)   LMP 11/04/2022   BMI 30.57 kg/m    Physical Exam Vitals and nursing note reviewed. Exam conducted with a chaperone present.  Constitutional:      Appearance: Normal appearance.  HENT:     Head: Normocephalic and atraumatic.  Pulmonary:     Effort: Pulmonary effort is normal.     Breath sounds: Normal breath sounds.  Genitourinary:    General: Normal vulva.     Exam position: Lithotomy position.     Vagina: Normal.     Cervix: Normal.  Skin:    General: Skin is warm and dry.  Neurological:     General: No focal deficit present.     Mental Status: She is alert.  Psychiatric:        Mood and Affect: Mood normal.        Behavior: Behavior normal.        Thought Content: Thought content normal.        Judgment: Judgment normal.        Assessment &  Plan:   1. Screening for cervical cancer Routine pap collected today, follow per ASCCP guidelines - Cytology - PAP( [facility])   Darliss Cheney, MD [facility]

## 2022-11-18 ENCOUNTER — Ambulatory Visit (INDEPENDENT_AMBULATORY_CARE_PROVIDER_SITE_OTHER): Payer: Medicaid Other | Admitting: Family Medicine

## 2022-11-18 ENCOUNTER — Encounter: Payer: Self-pay | Admitting: Family Medicine

## 2022-11-18 VITALS — BP 100/58 | HR 68 | Temp 98.9°F | Ht 69.5 in | Wt 208.1 lb

## 2022-11-18 DIAGNOSIS — M25552 Pain in left hip: Secondary | ICD-10-CM | POA: Diagnosis not present

## 2022-11-18 NOTE — Progress Notes (Signed)
New Patient Office Visit  Subjective    Patient ID: Carol Brooks, female    DOB: 28-Jul-1996  Age: 27 y.o. MRN: [id number]  CC:  Chief Complaint  Patient presents with   Establish Care    HPI Carol Brooks presents to establish care Patient is reporting a new symptom of left  hip or groin pain. Started about 2 1/2 month ago, states it happens mostly when she is sitting, describes the pain as sharp and stabbing. States that she would have to change the position in her seat and be careful when she gets up. States that it does hurt worse when she stands up and bears weight. Not tender to touch. No other associated symptoms or issues.   Pt rpeorts she is doing HIIT training, then had a breast augmentation at the end of January. States that when she is actively exercising she does not have the pain. No lumps or abnormalities in the groin noted.   Outpatient Encounter Medications as of 11/18/2022  Medication Sig   Ferrous Sulfate (IRON PO) Take by mouth daily.   ibuprofen (ADVIL) 600 MG tablet Take 1 tablet (600 mg total) by mouth every 6 (six) hours.   MAGNESIUM PO Take by mouth daily.   Multiple Vitamin (MULTIVITAMIN ADULT PO) Take by mouth.   VITAMIN D PO Take by mouth daily.   No facility-administered encounter medications on file as of 11/18/2022.    Past Medical History:  Diagnosis Date   Gestational hypertension     Past Surgical History:  Procedure Laterality Date   BREAST ENHANCEMENT SURGERY  10/12/2022    Family History  Problem Relation Age of Onset   Hypertension Mother    Hypertension Father    Lung cancer Maternal Grandmother    Hypertension Maternal Grandfather    Breast cancer Maternal Great-grandmother     Social History   Socioeconomic History   Marital status: Single    Spouse name: Not on file   Number of children: Not on file   Years of education: Not on file   Highest education level: Not on file  Occupational History   Occupation:  Glass blower/designer    Comment: [employer]  Tobacco Use   Smoking status: Never   Smokeless tobacco: Never  Vaping Use   Vaping Use: Never used  Substance and Sexual Activity   Alcohol use: Not Currently    Comment: 0-1/week   Drug use: Yes    Types: Marijuana    Comment: last use of weed july 2022   Sexual activity: Yes    Partners: Male    Birth control/protection: None  Other Topics Concern   Not on file  Social History Narrative   Not on file   Social Determinants of Health   Financial Resource Strain: Not on file  Food Insecurity: No Food Insecurity (11/16/2022)   Hunger Vital Sign    Worried About Running Out of Food in the Last Year: Never true    Ran Out of Food in the Last Year: Never true  Transportation Needs: No Transportation Needs (11/16/2022)   PRAPARE - Hydrologist (Medical): No    Lack of Transportation (Non-Medical): No  Physical Activity: Not on file  Stress: Not on file  Social Connections: Not on file  Intimate Partner Violence: Not on file    Review of Systems  All other systems reviewed and are negative.  Objective    BP (!) 100/58 (BP Location: Left Arm, Patient Position: Sitting, Cuff Size: Large)   Pulse 68   Temp 98.9 F (37.2 C) (Oral)   Ht 5' 9.5" (1.765 m)   Wt 208 lb 1.6 oz (94.4 kg)   LMP 11/04/2022 (Exact Date)   SpO2 98%   BMI 30.29 kg/m   Physical Exam Vitals reviewed.  Constitutional:      Appearance: Normal appearance. She is well-groomed and normal weight.  Eyes:     Conjunctiva/sclera: Conjunctivae normal.  Neck:     Thyroid: No thyromegaly.  Cardiovascular:     Rate and Rhythm: Normal rate and regular rhythm.     Pulses: Normal pulses.     Heart sounds: S1 normal and S2 normal.  Pulmonary:     Effort: Pulmonary effort is normal.     Breath sounds: Normal breath sounds and air entry.  Abdominal:     General: Bowel sounds are normal.   Musculoskeletal:     Right lower leg: No edema.     Left lower leg: No edema.  Neurological:     Mental Status: She is alert and oriented to person, place, and time. Mental status is at baseline.     Gait: Gait is intact.  Psychiatric:        Mood and Affect: Mood and affect normal.        Speech: Speech normal.        Behavior: Behavior normal.        Judgment: Judgment normal.         Assessment & Plan:   Problem List Items Addressed This Visit       Unprioritized   Left hip pain - Primary    Will start work up with an x-ray of the left hip to rule out intraarticular causes. Most likely this represents a muscle strain or tendonitis. I gave pt handouts on adductor muscle exercises and recommended icing the area and using ibuprofen 800 mg every 8 hours as needed for the pain. Will discuss her x-rays results with her once they are available.       Relevant Orders   XR HIP UNILAT W OR W/O PELVIS 2-3 VIEWS LEFT    Return in about 1 year (around 11/18/2023) for annual physical exam.   Farrel Conners, MD

## 2022-11-18 NOTE — Assessment & Plan Note (Signed)
Will start work up with an x-ray of the left hip to rule out intraarticular causes. Most likely this represents a muscle strain or tendonitis. I gave pt handouts on adductor muscle exercises and recommended icing the area and using ibuprofen 800 mg every 8 hours as needed for the pain. Will discuss her x-rays results with her once they are available.

## 2022-11-23 LAB — CYTOLOGY - PAP: Diagnosis: NEGATIVE

## 2023-06-23 DIAGNOSIS — R051 Acute cough: Secondary | ICD-10-CM | POA: Diagnosis not present

## 2023-06-23 DIAGNOSIS — R52 Pain, unspecified: Secondary | ICD-10-CM | POA: Diagnosis not present

## 2023-06-23 DIAGNOSIS — R6883 Chills (without fever): Secondary | ICD-10-CM | POA: Diagnosis not present

## 2023-06-23 DIAGNOSIS — R0981 Nasal congestion: Secondary | ICD-10-CM | POA: Diagnosis not present

## 2023-07-02 DIAGNOSIS — J069 Acute upper respiratory infection, unspecified: Secondary | ICD-10-CM | POA: Diagnosis not present

## 2023-12-10 ENCOUNTER — Other Ambulatory Visit

## 2023-12-14 ENCOUNTER — Encounter: Admitting: Family Medicine

## 2023-12-17 ENCOUNTER — Ambulatory Visit

## 2023-12-17 ENCOUNTER — Ambulatory Visit: Admitting: Family Medicine

## 2023-12-17 VITALS — BP 104/50 | HR 83 | Temp 97.9°F | Ht 69.5 in | Wt 189.9 lb

## 2023-12-17 DIAGNOSIS — R1032 Left lower quadrant pain: Secondary | ICD-10-CM

## 2023-12-17 DIAGNOSIS — R102 Pelvic and perineal pain: Secondary | ICD-10-CM | POA: Diagnosis not present

## 2023-12-17 DIAGNOSIS — R300 Dysuria: Secondary | ICD-10-CM

## 2023-12-17 LAB — POC URINALSYSI DIPSTICK (AUTOMATED)
Bilirubin, UA: NEGATIVE
Blood, UA: NEGATIVE
Glucose, UA: NEGATIVE
Ketones, UA: NEGATIVE
Leukocytes, UA: NEGATIVE
Nitrite, UA: NEGATIVE
Protein, UA: NEGATIVE
Spec Grav, UA: >=1.03 — AB (ref 1.010–1.025)
Urobilinogen, UA: 0.2 U/dL
pH, UA: 6 (ref 5.0–8.0)

## 2023-12-17 NOTE — Progress Notes (Signed)
 Established Patient Office Visit  Subjective   Patient ID: Carol Brooks, female    DOB: Jan 02, 1996  Age: 28 y.o. MRN: [id number]  Chief Complaint  Patient presents with   Pelvic Pain    Patient complains of left pelvic pain and dysuria x2 weeks, no known injury, states she did not have an x-ray last year when initially ordered as she was feeling better    Patient reports left sided pelvic pain for the past 2 weeks, states that it kind of random, no association with lifting weight weights or exercising, states that it is more in the groin area. States that it is occasionally hurts to bear weight on the left leg. She is concerned that there is something wrong with the hip.   Pelvic Pain The patient's primary symptoms include pelvic pain.    Current Outpatient Medications  Medication Instructions   Ferrous Sulfate (IRON PO) Daily   ibuprofen (ADVIL) 600 mg, Oral, Every 6 hours   MAGNESIUM PO Daily   Multiple Vitamin (MULTIVITAMIN ADULT PO) Take by mouth.   VITAMIN D PO Daily    Patient Active Problem List   Diagnosis Date Noted   Left hip pain 11/18/2022      Review of Systems  Genitourinary:  Positive for pelvic pain.  All other systems reviewed and are negative.     Objective:     BP (!) 104/50   Pulse 83   Temp 97.9 F (36.6 C) (Oral)   Ht 5' 9.5" (1.765 m)   Wt 189 lb 14.4 oz (86.1 kg)   LMP 11/29/2023 (Exact Date)   SpO2 98%   BMI 27.64 kg/m    Physical Exam Vitals reviewed.  Constitutional:      Appearance: Normal appearance. She is normal weight.  Cardiovascular:     Rate and Rhythm: Normal rate and regular rhythm.     Heart sounds: No murmur heard. Pulmonary:     Effort: Pulmonary effort is normal.     Breath sounds: Normal breath sounds.  Abdominal:     General: Abdomen is flat. Bowel sounds are normal.     Palpations: Abdomen is soft.     Tenderness: There is abdominal tenderness (left groin area, no hernias felt on exam).   Neurological:     Mental Status: She is alert.      Results for orders placed or performed in visit on 12/17/23  POCT Urinalysis Dipstick (Automated)  Result Value Ref Range   Color, UA yellow    Clarity, UA clear    Glucose, UA Negative Negative   Bilirubin, UA negative    Ketones, UA negative    Spec Grav, UA >=1.030 (A) 1.010 - 1.025   Blood, UA negative    pH, UA 6.0 5.0 - 8.0   Protein, UA Negative Negative   Urobilinogen, UA 0.2 0.2 or 1.0 E.U./dL   Nitrite, UA negative    Leukocytes, UA Negative Negative      The ASCVD Risk score (Arnett DK, et al., 2019) failed to calculate for the following reasons:   The 2019 ASCVD risk score is only valid for ages 72 to 40    Assessment & Plan:  Dysuria -     POCT Urinalysis Dipstick (Automated)  Left groin pain -     DG HIP UNILAT W OR W/O PELVIS 1V LEFT; Future -     US PELVIC COMPLETE WITH TRANSVAGINAL; Future  Pelvic pain in female -     US PELVIC  COMPLETE WITH TRANSVAGINAL; Future   Urine is negative, concern for intra-pelvic pathology given the location, willalso check hip films to look at the joint. US pelvic ordered for work up  No follow-ups on file.    Karie Georges, MD

## 2023-12-19 ENCOUNTER — Encounter: Payer: Self-pay | Admitting: Family Medicine

## 2023-12-21 ENCOUNTER — Ambulatory Visit
Admission: RE | Admit: 2023-12-21 | Discharge: 2023-12-21 | Disposition: A | Discharge status: Home / Self Care | Source: Ambulatory Visit | Attending: Family Medicine | Admitting: Family Medicine

## 2023-12-21 DIAGNOSIS — R102 Pelvic and perineal pain: Secondary | ICD-10-CM

## 2023-12-21 DIAGNOSIS — D259 Leiomyoma of uterus, unspecified: Secondary | ICD-10-CM | POA: Diagnosis not present

## 2023-12-21 DIAGNOSIS — R1032 Left lower quadrant pain: Secondary | ICD-10-CM

## 2023-12-23 ENCOUNTER — Encounter: Payer: Self-pay | Admitting: Family Medicine

## 2024-04-24 ENCOUNTER — Ambulatory Visit (INDEPENDENT_AMBULATORY_CARE_PROVIDER_SITE_OTHER): Admitting: Family Medicine

## 2024-04-24 ENCOUNTER — Encounter: Payer: Self-pay | Admitting: Family Medicine

## 2024-04-24 VITALS — BP 100/60 | HR 75 | Temp 98.4°F | Ht 70.0 in | Wt 194.4 lb

## 2024-04-24 DIAGNOSIS — R140 Abdominal distension (gaseous): Secondary | ICD-10-CM | POA: Diagnosis not present

## 2024-04-24 DIAGNOSIS — Z0000 Encounter for general adult medical examination without abnormal findings: Secondary | ICD-10-CM

## 2024-04-24 DIAGNOSIS — Z13220 Encounter for screening for lipoid disorders: Secondary | ICD-10-CM

## 2024-04-24 NOTE — Progress Notes (Signed)
 Complete physical exam  Patient: Carol Brooks   DOB: 27-Feb-1996   28 y.o. Female  MRN: [id number]  Subjective:    Chief Complaint  Patient presents with   Annual Exam    Carol Brooks is a 28 y.o. female who presents today for a complete physical exam. She reports consuming a general diet, is getting good sources of protein, eats fast food about 3 times per week.  Gym/ health club routine includes HIIT program, pilates 3 times per week with cardio on some days. She generally feels well. She reports sleeping well. She does not have additional problems to discuss today.   Patient is reporting that the pelvic pain is resolved, however the abdominal bloating happens after she eats meals heavy in gluten. States that she is having regular BM's, no diarrhea, states she does have occasional abdominal pain intermittently, she would like to be tested for gluten allergy.  Most recent fall risk assessment:     No data to display           Most recent depression screenings:    11/18/2022   10:56 AM 11/16/2022    4:30 PM  PHQ 2/9 Scores  PHQ - 2 Score 0 0  PHQ- 9 Score  0    Vision:Within last year and Dental: No current dental problems and Receives regular dental care  Patient Active Problem List   Diagnosis Date Noted   Left hip pain 11/18/2022      Patient Care Team: Ozell Heron HERO, MD as PCP - General (Family Medicine)   Outpatient Medications Prior to Visit  Medication Sig   Ferrous Sulfate (IRON PO) Take by mouth daily.   ibuprofen  (ADVIL ) 600 MG tablet Take 1 tablet (600 mg total) by mouth every 6 (six) hours.   MAGNESIUM PO Take by mouth daily.   Multiple Vitamin (MULTIVITAMIN ADULT PO) Take by mouth.   VITAMIN D PO Take by mouth daily.   No facility-administered medications prior to visit.    Review of Systems  HENT:  Negative for hearing loss.   Eyes:  Negative for blurred vision.  Respiratory:  Negative for shortness of breath.   Cardiovascular:   Negative for chest pain.  Gastrointestinal: Negative.   Genitourinary: Negative.   Musculoskeletal:  Negative for back pain.  Neurological:  Negative for headaches.  Psychiatric/Behavioral:  Negative for depression.        Objective:     BP 100/60   Pulse 75   Temp 98.4 F (36.9 C) (Oral)   Ht 5' 10 (1.778 m)   Wt 194 lb 6.4 oz (88.2 kg)   LMP 04/15/2024 (Exact Date)   SpO2 96%   BMI 27.89 kg/m    Physical Exam Vitals reviewed.  Constitutional:      Appearance: Normal appearance. She is well-groomed and normal weight.  HENT:     Right Ear: Tympanic membrane and ear canal normal.     Left Ear: Tympanic membrane and ear canal normal.     Mouth/Throat:     Mouth: Mucous membranes are moist.     Pharynx: No posterior oropharyngeal erythema.  Eyes:     Conjunctiva/sclera: Conjunctivae normal.  Neck:     Thyroid: No thyromegaly.  Cardiovascular:     Rate and Rhythm: Normal rate and regular rhythm.     Pulses: Normal pulses.     Heart sounds: S1 normal and S2 normal.  Pulmonary:     Effort: Pulmonary effort is normal.  Breath sounds: Normal breath sounds and air entry.  Abdominal:     General: Abdomen is flat. Bowel sounds are normal.     Palpations: Abdomen is soft.  Musculoskeletal:     Right lower leg: No edema.     Left lower leg: No edema.  Lymphadenopathy:     Cervical: No cervical adenopathy.  Neurological:     Mental Status: She is alert and oriented to person, place, and time. Mental status is at baseline.     Gait: Gait is intact.  Psychiatric:        Mood and Affect: Mood and affect normal.        Speech: Speech normal.        Behavior: Behavior normal.        Judgment: Judgment normal.      No results found for any visits on 04/24/24.     Assessment & Plan:    Routine Health Maintenance and Physical Exam  Immunization History  Administered Date(s) Administered   Influenza,inj,Quad PF,6+ Mos 06/30/2021   Influenza-Unspecified  06/14/2022   PFIZER(Purple Top)SARS-COV-2 Vaccination 10/08/2020, 10/29/2021   Tdap 09/19/2021    Health Maintenance  Topic Date Due   Hepatitis B Vaccines (1 of 3 - 19+ 3-dose series) Never done   COVID-19 Vaccine (3 - Pfizer risk series) 11/26/2021   HPV VACCINES (1 - 3-dose SCDM series) Never done   INFLUENZA VACCINE  04/14/2024   Cervical Cancer Screening (Pap smear)  11/15/2025   DTaP/Tdap/Td (2 - Td or Tdap) 09/20/2031   Hepatitis C Screening  Completed   HIV Screening  Completed   Meningococcal B Vaccine  Aged Out    Discussed health benefits of physical activity, and encouraged her to engage in regular exercise appropriate for her age and condition.  Abdominal bloating -     Celiac panel 10; Future  Lipid screening -     Lipid panel; Future  Routine general medical examination at a health care facility -     CBC with Differential/Platelet; Future -     Comprehensive metabolic panel with GFR; Future  Normal physical exam findings. I counseled the patient on the recommended amount of exercise per CDC recommendation. I reviewed preventative screening, immunizations, and medical history and updated in the chart, and appropriate labs and vaccinations were ordered. Handouts given on healthy eating and exercise.    Return in 1 year (on 04/24/2025) for annual physical exam.     Heron CHRISTELLA Sharper, MD

## 2024-04-24 NOTE — Patient Instructions (Signed)

## 2024-04-27 ENCOUNTER — Other Ambulatory Visit (INDEPENDENT_AMBULATORY_CARE_PROVIDER_SITE_OTHER)

## 2024-04-27 DIAGNOSIS — R140 Abdominal distension (gaseous): Secondary | ICD-10-CM

## 2024-04-27 DIAGNOSIS — Z0000 Encounter for general adult medical examination without abnormal findings: Secondary | ICD-10-CM | POA: Diagnosis not present

## 2024-04-27 DIAGNOSIS — Z13220 Encounter for screening for lipoid disorders: Secondary | ICD-10-CM

## 2024-04-27 LAB — CBC WITH DIFFERENTIAL/PLATELET
Basophils Absolute: 0.1 K/uL (ref 0.0–0.1)
Basophils Relative: 1.1 % (ref 0.0–3.0)
Eosinophils Absolute: 0.2 K/uL (ref 0.0–0.7)
Eosinophils Relative: 4.3 % (ref 0.0–5.0)
HCT: 41 % (ref 36.0–46.0)
Hemoglobin: 13.7 g/dL (ref 12.0–15.0)
Lymphocytes Relative: 32.9 % (ref 12.0–46.0)
Lymphs Abs: 1.7 K/uL (ref 0.7–4.0)
MCHC: 33.3 g/dL (ref 30.0–36.0)
MCV: 91.6 fl (ref 78.0–100.0)
Monocytes Absolute: 0.3 K/uL (ref 0.1–1.0)
Monocytes Relative: 5.2 % (ref 3.0–12.0)
Neutro Abs: 3 K/uL (ref 1.4–7.7)
Neutrophils Relative %: 56.5 % (ref 43.0–77.0)
Platelets: 281 K/uL (ref 150.0–400.0)
RBC: 4.48 Mil/uL (ref 3.87–5.11)
RDW: 13.6 % (ref 11.5–15.5)
WBC: 5.3 K/uL (ref 4.0–10.5)

## 2024-04-27 LAB — COMPREHENSIVE METABOLIC PANEL WITH GFR
ALT: 16 U/L (ref 0–35)
AST: 17 U/L (ref 0–37)
Albumin: 4.4 g/dL (ref 3.5–5.2)
Alkaline Phosphatase: 46 U/L (ref 39–117)
BUN: 10 mg/dL (ref 6–23)
CO2: 25 meq/L (ref 19–32)
Calcium: 9.4 mg/dL (ref 8.4–10.5)
Chloride: 107 meq/L (ref 96–112)
Creatinine, Ser: 0.82 mg/dL (ref 0.40–1.20)
GFR: 97.49 mL/min (ref >60.00)
Glucose, Bld: 96 mg/dL (ref 70–99)
Potassium: 5.1 meq/L (ref 3.5–5.1)
Sodium: 140 meq/L (ref 135–145)
Total Bilirubin: 0.6 mg/dL (ref 0.2–1.2)
Total Protein: 7.1 g/dL (ref 6.0–8.3)

## 2024-04-27 LAB — LIPID PANEL
Cholesterol: 169 mg/dL (ref 0–200)
HDL: 62.9 mg/dL (ref >39.00)
LDL Cholesterol: 86 mg/dL (ref 0–99)
NonHDL: 106.37
Total CHOL/HDL Ratio: 3
Triglycerides: 104 mg/dL (ref 0.0–149.0)
VLDL: 20.8 mg/dL (ref 0.0–40.0)

## 2024-04-28 ENCOUNTER — Ambulatory Visit: Payer: Self-pay | Admitting: Family Medicine

## 2024-05-02 LAB — CELIAC PANEL 10
Antigliadin Abs, IgA: 4 U (ref 0–19)
Gliadin IgG: 2 U (ref 0–19)
IgA/Immunoglobulin A, Serum: 258 mg/dL (ref 87–352)
Tissue Transglut Ab: <2 U/mL (ref 0–5)
Transglutaminase IgA: <2 U/mL (ref 0–3)

## 2024-10-03 ENCOUNTER — Other Ambulatory Visit: Payer: Self-pay

## 2024-10-03 ENCOUNTER — Emergency Department (HOSPITAL_BASED_OUTPATIENT_CLINIC_OR_DEPARTMENT_OTHER)
Admission: EM | Admit: 2024-10-03 | Discharge: 2024-10-03 | Disposition: A | Discharge status: Home / Self Care | Source: Ambulatory Visit | Attending: Emergency Medicine | Admitting: Emergency Medicine

## 2024-10-03 ENCOUNTER — Encounter (HOSPITAL_BASED_OUTPATIENT_CLINIC_OR_DEPARTMENT_OTHER): Payer: Self-pay | Admitting: Emergency Medicine

## 2024-10-03 DIAGNOSIS — M545 Low back pain, unspecified: Secondary | ICD-10-CM | POA: Insufficient documentation

## 2024-10-03 DIAGNOSIS — R112 Nausea with vomiting, unspecified: Secondary | ICD-10-CM | POA: Diagnosis present

## 2024-10-03 DIAGNOSIS — R000 Tachycardia, unspecified: Secondary | ICD-10-CM | POA: Insufficient documentation

## 2024-10-03 DIAGNOSIS — Z79899 Other long term (current) drug therapy: Secondary | ICD-10-CM | POA: Insufficient documentation

## 2024-10-03 DIAGNOSIS — A084 Viral intestinal infection, unspecified: Secondary | ICD-10-CM | POA: Insufficient documentation

## 2024-10-03 LAB — URINALYSIS, ROUTINE W REFLEX MICROSCOPIC
Bacteria, UA: NONE SEEN
Bilirubin Urine: NEGATIVE
Glucose, UA: NEGATIVE mg/dL
Hgb urine dipstick: NEGATIVE
Ketones, ur: 40 mg/dL — AB
Leukocytes,Ua: NEGATIVE
Nitrite: NEGATIVE
Protein, ur: 30 mg/dL — AB
Specific Gravity, Urine: 1.039 — ABNORMAL HIGH (ref 1.005–1.030)
pH: 8 (ref 5.0–8.0)

## 2024-10-03 LAB — CBC WITH DIFFERENTIAL/PLATELET
Abs Immature Granulocytes: 0.03 K/uL (ref 0.00–0.07)
Basophils Absolute: 0 K/uL (ref 0.0–0.1)
Basophils Relative: 0 %
Eosinophils Absolute: 0 K/uL (ref 0.0–0.5)
Eosinophils Relative: 0 %
HCT: 40.1 % (ref 36.0–46.0)
Hemoglobin: 13.7 g/dL (ref 12.0–15.0)
Immature Granulocytes: 0 %
Lymphocytes Relative: 5 %
Lymphs Abs: 0.3 K/uL — ABNORMAL LOW (ref 0.7–4.0)
MCH: 30.7 pg (ref 26.0–34.0)
MCHC: 34.2 g/dL (ref 30.0–36.0)
MCV: 89.9 fL (ref 80.0–100.0)
Monocytes Absolute: 0.3 K/uL (ref 0.1–1.0)
Monocytes Relative: 4 %
Neutro Abs: 6.8 K/uL (ref 1.7–7.7)
Neutrophils Relative %: 91 %
Platelets: 255 K/uL (ref 150–400)
RBC: 4.46 MIL/uL (ref 3.87–5.11)
RDW: 12.9 % (ref 11.5–15.5)
WBC: 7.4 K/uL (ref 4.0–10.5)
nRBC: 0 % (ref 0.0–0.2)

## 2024-10-03 LAB — COMPREHENSIVE METABOLIC PANEL WITH GFR
ALT: 18 U/L (ref 0–44)
AST: 20 U/L (ref 15–41)
Albumin: 4.6 g/dL (ref 3.5–5.0)
Alkaline Phosphatase: 47 U/L (ref 38–126)
Anion gap: 13 (ref 5–15)
BUN: 12 mg/dL (ref 6–20)
CO2: 22 mmol/L (ref 22–32)
Calcium: 9.7 mg/dL (ref 8.9–10.3)
Chloride: 103 mmol/L (ref 98–111)
Creatinine, Ser: 0.77 mg/dL (ref 0.44–1.00)
GFR, Estimated: >60 mL/min
Glucose, Bld: 103 mg/dL — ABNORMAL HIGH (ref 70–99)
Potassium: 3.7 mmol/L (ref 3.5–5.1)
Sodium: 138 mmol/L (ref 135–145)
Total Bilirubin: 1.1 mg/dL (ref 0.0–1.2)
Total Protein: 7.4 g/dL (ref 6.5–8.1)

## 2024-10-03 LAB — RESP PANEL BY RT-PCR (RSV, FLU A&B, COVID)  RVPGX2
Influenza A by PCR: NEGATIVE
Influenza B by PCR: NEGATIVE
Resp Syncytial Virus by PCR: NEGATIVE
SARS Coronavirus 2 by RT PCR: NEGATIVE

## 2024-10-03 LAB — HCG, SERUM, QUALITATIVE: Preg, Serum: NEGATIVE

## 2024-10-03 LAB — LIPASE, BLOOD: Lipase: 18 U/L (ref 11–51)

## 2024-10-03 LAB — TROPONIN T, HIGH SENSITIVITY: Troponin T High Sensitivity: <6 ng/L (ref 0–19)

## 2024-10-03 LAB — D-DIMER, QUANTITATIVE: D-Dimer, Quant: 0.4 ug{FEU}/mL (ref 0.00–0.50)

## 2024-10-03 MED ORDER — ONDANSETRON HCL 4 MG PO TABS: 4.0000 mg | ORAL_TABLET | Freq: Four times a day (QID) | ORAL | 0 refills | Status: AC

## 2024-10-03 MED ORDER — METHOCARBAMOL 500 MG PO TABS: 500.0000 mg | ORAL_TABLET | Freq: Two times a day (BID) | ORAL | 0 refills | Status: AC

## 2024-10-03 MED ORDER — LACTATED RINGERS IV BOLUS
1000.0000 mL | Freq: Once | INTRAVENOUS | Status: AC
  Administered 2024-10-03: 1000 mL via INTRAVENOUS

## 2024-10-03 MED ORDER — NAPROXEN 500 MG PO TABS: 500.0000 mg | ORAL_TABLET | Freq: Two times a day (BID) | ORAL | 0 refills | Status: AC

## 2024-10-03 MED ORDER — ONDANSETRON HCL 4 MG/2ML IJ SOLN
4.0000 mg | Freq: Once | INTRAMUSCULAR | Status: AC
  Administered 2024-10-03: 4 mg via INTRAVENOUS
  Filled 2024-10-03: qty 2

## 2024-10-03 MED ORDER — KETOROLAC TROMETHAMINE 30 MG/ML IJ SOLN
30.0000 mg | Freq: Once | INTRAMUSCULAR | Status: AC
  Administered 2024-10-03: 30 mg via INTRAVENOUS
  Filled 2024-10-03: qty 1

## 2024-10-03 NOTE — Discharge Instructions (Signed)
 You were seen today for low back pain and likely viral gastroenteritis.  Recommend continue to stay hydrated as your lab work today did note that you were dehydrated.  Your exam and labs otherwise are very reassuring, recommend you continue to go home and continue to hydrate well eating and small meals to help avoid stomach upset.  Additionally you can add in Gatorade with your water on occasion if you are not eating as frequently.  Recommend continued follow-up with your PCP for any persistent symptoms, return to the ER for new or worsening symptoms which would include uncontrollable pain, blood in urine, painful urination, blood in stool, shortness of breath that is worsening with chest pain, confusion or fainting.

## 2024-10-03 NOTE — ED Triage Notes (Signed)
 Reports bilateral lower back pain w/ n/v/d starting this morning around 0100.

## 2024-10-03 NOTE — ED Provider Notes (Signed)
 " [place] EMERGENCY DEPARTMENT AT [facility] Provider Note   CSN: [id number] Arrival date & time: 10/03/24  1358     Patient presents with: Back Pain   Carol Brooks is a 29 y.o. female.  Back Pain Patient is a 29 year old female to the ED today for concerns for bodyaches, chills, nausea, vomiting, watery diarrhea accompanied with low back pain that started acutely this a.m., waking her up from sleep.  Noted to have been seen by urgent care where she was noted to be tachycardic, short of breath on exertion and with bilateral flank pain, recommended to come into the emergency department for evaluation.  Noted that symptoms have mildly improved since arriving to the emergency department.  No chronic past medical history.  Notably started her menstrual cycle yesterday.  Denies any alcohol use.   But did report marijuana use yesterday.  Denies saddle paresthesia, fecal/urinary incontinence, IVD use,  Denies fever, headache, blurry vision, cough, congestion, otalgia, vertigo, tinnitus, sore throat, chest pain, abdominal pain,, emesis, melena, hematochezia, dysuria, hematuria,, vaginal discharge, vaginal pain, lower leg swelling, rashes.    Prior to Admission medications  Medication Sig Start Date End Date Taking? Authorizing Provider  naproxen  (NAPROSYN ) 500 MG tablet Take 1 tablet (500 mg total) by mouth 2 (two) times daily. 10/03/24  Yes Devaughn Savant S, PA-C  ondansetron  (ZOFRAN ) 4 MG tablet Take 1 tablet (4 mg total) by mouth every 6 (six) hours. 10/03/24  Yes Jaekwon Mcclune S, PA-C  Ferrous Sulfate (IRON PO) Take by mouth daily.    [provider]  ibuprofen  (ADVIL ) 600 MG tablet Take 1 tablet (600 mg total) by mouth every 6 (six) hours. 12/16/21   Stinson, Jacob J, DO  MAGNESIUM PO Take by mouth daily.    [provider]  Multiple Vitamin (MULTIVITAMIN ADULT PO) Take by mouth.    [provider]  VITAMIN D PO Take by mouth daily.    [provider]    Allergies: Patient has no known allergies.    Review of Systems  Musculoskeletal:  Positive for back pain.  All other systems reviewed and are negative.   Updated Vital Signs BP 120/74   Pulse (!) 106   Temp 100.1 F (37.8 C)   Resp 16   Ht 5' 10 (1.778 m)   Wt 88.2 kg   SpO2 98%   BMI 27.90 kg/m   Physical Exam Vitals and nursing note reviewed.  Constitutional:      General: She is not in acute distress.    Appearance: Normal appearance. She is not ill-appearing or diaphoretic.  HENT:     Head: Normocephalic and atraumatic.  Eyes:     General: No scleral icterus.       Right eye: No discharge.        Left eye: No discharge.     Extraocular Movements: Extraocular movements intact.     Conjunctiva/sclera: Conjunctivae normal.     Pupils: Pupils are equal, round, and reactive to light.  Neck:     Comments: No meningeal signs. Cardiovascular:     Rate and Rhythm: Regular rhythm. Tachycardia present.     Pulses: Normal pulses.     Heart sounds: Normal heart sounds. No murmur heard.    No friction rub. No gallop.  Pulmonary:     Effort: Pulmonary effort is normal. No respiratory distress.     Breath sounds: No stridor. No wheezing, rhonchi or rales.  Chest:     Chest  wall: No tenderness.  Abdominal:     General: Abdomen is flat. There is no distension.     Palpations: Abdomen is soft.     Tenderness: There is no abdominal tenderness. There is no right CVA tenderness, left CVA tenderness, guarding or rebound.  Musculoskeletal:        General: Tenderness (Notably has focal tenderness to lower back.) present. No swelling, deformity or signs of injury.     Cervical back: Normal range of motion. No rigidity or tenderness.     Right lower leg: No edema.     Left lower leg: No edema.  Skin:    General: Skin is warm and dry.     Findings: No bruising, erythema or lesion.  Neurological:     General: No focal deficit present.     Mental Status: She is  alert and oriented to person, place, and time. Mental status is at baseline.     Sensory: No sensory deficit.     Motor: No weakness.  Psychiatric:        Mood and Affect: Mood normal.     (all labs ordered are listed, but only abnormal results are displayed) Labs Reviewed  CBC WITH DIFFERENTIAL/PLATELET - Abnormal; Notable for the following components:      Result Value   Lymphs Abs 0.3 (*)    All other components within normal limits  COMPREHENSIVE METABOLIC PANEL WITH GFR - Abnormal; Notable for the following components:   Glucose, Bld 103 (*)    All other components within normal limits  URINALYSIS, ROUTINE W REFLEX MICROSCOPIC - Abnormal; Notable for the following components:   Specific Gravity, Urine 1.039 (*)    Ketones, ur 40 (*)    Protein, ur 30 (*)    All other components within normal limits  RESP PANEL BY RT-PCR (RSV, FLU A&B, COVID)  RVPGX2  LIPASE, BLOOD  HCG, SERUM, QUALITATIVE  D-DIMER, QUANTITATIVE  TROPONIN T, HIGH SENSITIVITY    EKG: EKG Interpretation Date/Time:  Tuesday October 03 2024 17:08:30 EST Ventricular Rate:  101 PR Interval:  137 QRS Duration:  85 QT Interval:  321 QTC Calculation: 416 R Axis:   88  Text Interpretation: Sinus tachycardia Borderline Q waves in lateral leads Borderline repolarization abnormality No previous ECGs available Confirmed by Ellouise Fine ([id number]) on 10/03/2024 5:14:01 PM  Radiology: No results found.  Procedures   Medications Ordered in the ED  lactated ringers  bolus 1,000 mL (0 mLs Intravenous Stopped 10/03/24 1836)  ondansetron  (ZOFRAN ) injection 4 mg (4 mg Intravenous Given 10/03/24 1723)  ketorolac  (TORADOL ) 30 MG/ML injection 30 mg (30 mg Intravenous Given 10/03/24 1838)                                    Medical Decision Making Amount and/or Complexity of Data Reviewed Labs: ordered. ECG/medicine tests: ordered.  Risk Prescription drug management.   This patient is a 29 year old female who  presents to the ED for concern of nausea,, diarrhea and low back pain that all started acutely this morning, noting to have had vomiting that been followed by low back pain afterward with generalized bodyaches.  Notably has had symptoms greatly improved since arriving to the ER.  Notably was seen by urgent care and told to come to ER due to having bilateral flank pain with shortness of breath and was tachycardic.  On physical exam, patient is in no acute distress, afebrile,  alert and orient x 4, speaking in full sentences, nontachypneic.  Noted was tachycardic with heart rate of low 100s.  Abdomen is nontender, no CVA tenderness, no chest tenderness, no lower leg swelling.  Notable does have some mild lower back tenderness to palpation.  Unremarkable exam otherwise.  Currently is afebrile, with overall appearing likely as a viral enteritis.  Low suspicion for pyelonephritis, nephrolithiasis, spinal abscess.  Lab work was otherwise unremarkable.  However UA did note ketones likely dehydration, which she was provided LR and Toradol  which helped with both symptoms of low back pain and dehydration.  On reevaluation notes that she is feeling slightly better however is still borderline tachycardic.  Will have continued to go home and monitor symptoms, return to the ED for any new or worsening symptoms.  Patient agrees with this.  Patient vital signs have remained stable throughout the course of patient's time in the ED. Low suspicion for any other emergent pathology at this time. I believe this patient is safe to be discharged. Provided strict return to ER precautions. Patient expressed agreement and understanding of plan. All questions were answered.  Differential diagnoses prior to evaluation: The emergent differential diagnosis includes, but is not limited to, gastroenteritis, pancreatitis, PE, ACS, AAS, nephrolithiasis, cauda equina, spinal abscess, diverticulitis, cholecystitis, hepatitis. This is not an  exhaustive differential.   Past Medical History / Co-morbidities / Social History: Gestational hypertension  Additional history: Chart reviewed. Pertinent results include:   Last seen by PCP on 04/24/2024 for abdominal bloating  Lab Tests/Imaging studies: I personally interpreted labs/imaging and the pertinent results include: CBC unremarkable CMP unremarkable Lipase unremarkable hCG qualitative negative UA notes LE specific having ketones like indicate dehydration D-dimer unremarkable Troponin undetectable Respiratory panel negative.  Cardiac monitoring: EKG obtained and interpreted by myself and attending physician which shows: Sinus tachycardia  EKG Interpretation Date/Time:  Tuesday October 03 2024 17:08:30 EST Ventricular Rate:  101 PR Interval:  137 QRS Duration:  85 QT Interval:  321 QTC Calculation: 416 R Axis:   88  Text Interpretation: Sinus tachycardia Borderline Q waves in lateral leads Borderline repolarization abnormality No previous ECGs available Confirmed by Ellouise Fine ([id number]) on 10/03/2024 5:14:01 PM          Medications: I ordered medication including LR, Toradol , Zofran , naproxen .  I have reviewed the patients home medicines and have made adjustments as needed.  Critical Interventions: None  Social Determinants of Health: Has good follow-up PCP  Disposition: After consideration of the diagnostic results and the patients response to treatment, I feel that the patient would benefit from discharge and shortness of breath.   emergency department workup does not suggest an emergent condition requiring admission or immediate intervention beyond what has been performed at this time. The plan is: Follow-up with PCP, turn to the ER for new or worsening symptoms, management at home. The patient is safe for discharge and has been instructed to return immediately for worsening symptoms, change in symptoms or any other concerns.  Final diagnoses:  Viral  gastroenteritis  Acute bilateral low back pain without sciatica    ED Discharge Orders          Ordered    ondansetron  (ZOFRAN ) 4 MG tablet  Every 6 hours        10/03/24 1932    naproxen  (NAPROSYN ) 500 MG tablet  2 times daily        10/03/24 1932  Beola Terrall RAMAN, PA-C 10/03/24 1933    Kingsley, Victoria K, DO 10/03/24 2317  "
